# Patient Record
Sex: Female | Born: 1975
Health system: Southern US, Community
[De-identification: ages and names within clinical notes are randomized; demographics above are authoritative.]

## PROBLEM LIST (undated history)

## (undated) DIAGNOSIS — Z86718 Personal history of other venous thrombosis and embolism: Secondary | ICD-10-CM

## (undated) DIAGNOSIS — M329 Systemic lupus erythematosus, unspecified: Secondary | ICD-10-CM

## (undated) DIAGNOSIS — IMO0002 Reserved for concepts with insufficient information to code with codable children: Secondary | ICD-10-CM

## (undated) DIAGNOSIS — N200 Calculus of kidney: Secondary | ICD-10-CM

## (undated) DIAGNOSIS — R760 Raised antibody titer: Secondary | ICD-10-CM

## (undated) DIAGNOSIS — D689 Coagulation defect, unspecified: Secondary | ICD-10-CM

## (undated) HISTORY — PX: OTHER SURGICAL HISTORY: SHX169

## (undated) HISTORY — DX: Coagulation defect, unspecified: D68.9

## (undated) HISTORY — DX: Personal history of other venous thrombosis and embolism: Z86.718

## (undated) HISTORY — DX: Systemic lupus erythematosus, unspecified: M32.9

## (undated) HISTORY — PX: FINGER SURGERY: SHX640

## (undated) HISTORY — DX: Reserved for concepts with insufficient information to code with codable children: IMO0002

## (undated) HISTORY — DX: Raised antibody titer: R76.0

## (undated) HISTORY — DX: Calculus of kidney: N20.0

---

## 2002-07-11 ENCOUNTER — Encounter: Payer: Self-pay | Admitting: Orthopedic Surgery

## 2002-07-11 ENCOUNTER — Ambulatory Visit (HOSPITAL_COMMUNITY): Admission: RE | Admit: 2002-07-11 | Discharge: 2002-07-11 | Payer: Self-pay | Admitting: Orthopedic Surgery

## 2016-02-22 LAB — HM PAP SMEAR

## 2017-05-08 DIAGNOSIS — M9904 Segmental and somatic dysfunction of sacral region: Secondary | ICD-10-CM | POA: Diagnosis not present

## 2017-05-08 DIAGNOSIS — M9903 Segmental and somatic dysfunction of lumbar region: Secondary | ICD-10-CM | POA: Diagnosis not present

## 2017-05-08 DIAGNOSIS — M9905 Segmental and somatic dysfunction of pelvic region: Secondary | ICD-10-CM | POA: Diagnosis not present

## 2017-05-11 DIAGNOSIS — Z86718 Personal history of other venous thrombosis and embolism: Secondary | ICD-10-CM | POA: Diagnosis not present

## 2017-05-11 DIAGNOSIS — D6861 Antiphospholipid syndrome: Secondary | ICD-10-CM | POA: Diagnosis not present

## 2017-05-16 DIAGNOSIS — D6861 Antiphospholipid syndrome: Secondary | ICD-10-CM | POA: Diagnosis not present

## 2017-05-16 DIAGNOSIS — Z86718 Personal history of other venous thrombosis and embolism: Secondary | ICD-10-CM | POA: Diagnosis not present

## 2017-05-18 DIAGNOSIS — D6861 Antiphospholipid syndrome: Secondary | ICD-10-CM | POA: Diagnosis not present

## 2017-05-18 DIAGNOSIS — Z86718 Personal history of other venous thrombosis and embolism: Secondary | ICD-10-CM | POA: Diagnosis not present

## 2017-05-21 DIAGNOSIS — D6861 Antiphospholipid syndrome: Secondary | ICD-10-CM | POA: Diagnosis not present

## 2017-05-21 DIAGNOSIS — Z86718 Personal history of other venous thrombosis and embolism: Secondary | ICD-10-CM | POA: Diagnosis not present

## 2017-05-25 DIAGNOSIS — M329 Systemic lupus erythematosus, unspecified: Secondary | ICD-10-CM | POA: Diagnosis not present

## 2017-05-30 DIAGNOSIS — M9904 Segmental and somatic dysfunction of sacral region: Secondary | ICD-10-CM | POA: Diagnosis not present

## 2017-05-30 DIAGNOSIS — M9903 Segmental and somatic dysfunction of lumbar region: Secondary | ICD-10-CM | POA: Diagnosis not present

## 2017-05-30 DIAGNOSIS — M9905 Segmental and somatic dysfunction of pelvic region: Secondary | ICD-10-CM | POA: Diagnosis not present

## 2017-06-01 DIAGNOSIS — Z86718 Personal history of other venous thrombosis and embolism: Secondary | ICD-10-CM | POA: Diagnosis not present

## 2017-06-01 DIAGNOSIS — D6861 Antiphospholipid syndrome: Secondary | ICD-10-CM | POA: Diagnosis not present

## 2017-06-10 DIAGNOSIS — M329 Systemic lupus erythematosus, unspecified: Secondary | ICD-10-CM | POA: Diagnosis not present

## 2017-06-10 DIAGNOSIS — D6861 Antiphospholipid syndrome: Secondary | ICD-10-CM | POA: Diagnosis not present

## 2017-06-28 DIAGNOSIS — M9903 Segmental and somatic dysfunction of lumbar region: Secondary | ICD-10-CM | POA: Diagnosis not present

## 2017-06-28 DIAGNOSIS — M9904 Segmental and somatic dysfunction of sacral region: Secondary | ICD-10-CM | POA: Diagnosis not present

## 2017-06-28 DIAGNOSIS — M9905 Segmental and somatic dysfunction of pelvic region: Secondary | ICD-10-CM | POA: Diagnosis not present

## 2017-07-18 DIAGNOSIS — M9905 Segmental and somatic dysfunction of pelvic region: Secondary | ICD-10-CM | POA: Diagnosis not present

## 2017-07-18 DIAGNOSIS — M9904 Segmental and somatic dysfunction of sacral region: Secondary | ICD-10-CM | POA: Diagnosis not present

## 2017-07-18 DIAGNOSIS — M9903 Segmental and somatic dysfunction of lumbar region: Secondary | ICD-10-CM | POA: Diagnosis not present

## 2017-07-24 DIAGNOSIS — M9905 Segmental and somatic dysfunction of pelvic region: Secondary | ICD-10-CM | POA: Diagnosis not present

## 2017-07-24 DIAGNOSIS — M9904 Segmental and somatic dysfunction of sacral region: Secondary | ICD-10-CM | POA: Diagnosis not present

## 2017-07-24 DIAGNOSIS — M9903 Segmental and somatic dysfunction of lumbar region: Secondary | ICD-10-CM | POA: Diagnosis not present

## 2017-07-27 DIAGNOSIS — Z23 Encounter for immunization: Secondary | ICD-10-CM | POA: Diagnosis not present

## 2017-08-02 DIAGNOSIS — M9905 Segmental and somatic dysfunction of pelvic region: Secondary | ICD-10-CM | POA: Diagnosis not present

## 2017-08-02 DIAGNOSIS — M9903 Segmental and somatic dysfunction of lumbar region: Secondary | ICD-10-CM | POA: Diagnosis not present

## 2017-08-02 DIAGNOSIS — M9904 Segmental and somatic dysfunction of sacral region: Secondary | ICD-10-CM | POA: Diagnosis not present

## 2017-08-06 ENCOUNTER — Encounter: Payer: Self-pay | Admitting: Gastroenterology

## 2017-08-09 DIAGNOSIS — M9904 Segmental and somatic dysfunction of sacral region: Secondary | ICD-10-CM | POA: Diagnosis not present

## 2017-08-09 DIAGNOSIS — M9903 Segmental and somatic dysfunction of lumbar region: Secondary | ICD-10-CM | POA: Diagnosis not present

## 2017-08-09 DIAGNOSIS — M9905 Segmental and somatic dysfunction of pelvic region: Secondary | ICD-10-CM | POA: Diagnosis not present

## 2017-09-07 DIAGNOSIS — N921 Excessive and frequent menstruation with irregular cycle: Secondary | ICD-10-CM | POA: Diagnosis not present

## 2017-09-07 DIAGNOSIS — Z86718 Personal history of other venous thrombosis and embolism: Secondary | ICD-10-CM | POA: Diagnosis not present

## 2017-09-07 DIAGNOSIS — Z7901 Long term (current) use of anticoagulants: Secondary | ICD-10-CM | POA: Diagnosis not present

## 2017-09-07 DIAGNOSIS — Z5181 Encounter for therapeutic drug level monitoring: Secondary | ICD-10-CM | POA: Diagnosis not present

## 2017-09-11 DIAGNOSIS — M3219 Other organ or system involvement in systemic lupus erythematosus: Secondary | ICD-10-CM | POA: Diagnosis not present

## 2017-09-11 DIAGNOSIS — R768 Other specified abnormal immunological findings in serum: Secondary | ICD-10-CM | POA: Diagnosis not present

## 2017-09-11 DIAGNOSIS — I80201 Phlebitis and thrombophlebitis of unspecified deep vessels of right lower extremity: Secondary | ICD-10-CM | POA: Diagnosis not present

## 2017-09-11 DIAGNOSIS — R7982 Elevated C-reactive protein (CRP): Secondary | ICD-10-CM | POA: Diagnosis not present

## 2017-09-12 DIAGNOSIS — R5383 Other fatigue: Secondary | ICD-10-CM | POA: Diagnosis not present

## 2017-09-12 DIAGNOSIS — M3219 Other organ or system involvement in systemic lupus erythematosus: Secondary | ICD-10-CM | POA: Diagnosis not present

## 2017-09-14 DIAGNOSIS — Z86718 Personal history of other venous thrombosis and embolism: Secondary | ICD-10-CM | POA: Diagnosis not present

## 2017-09-19 ENCOUNTER — Encounter: Payer: Self-pay | Admitting: Gastroenterology

## 2017-09-19 ENCOUNTER — Ambulatory Visit: Payer: 59 | Admitting: Gastroenterology

## 2017-09-19 ENCOUNTER — Telehealth: Payer: Self-pay

## 2017-09-19 ENCOUNTER — Encounter (INDEPENDENT_AMBULATORY_CARE_PROVIDER_SITE_OTHER): Payer: Self-pay

## 2017-09-19 VITALS — BP 106/74 | HR 80 | Ht 67.0 in | Wt 203.0 lb

## 2017-09-19 DIAGNOSIS — K921 Melena: Secondary | ICD-10-CM | POA: Diagnosis not present

## 2017-09-19 DIAGNOSIS — Z7901 Long term (current) use of anticoagulants: Secondary | ICD-10-CM | POA: Diagnosis not present

## 2017-09-19 MED ORDER — NA SULFATE-K SULFATE-MG SULF 17.5-3.13-1.6 GM/177ML PO SOLN: 1.0000 | Freq: Once | ORAL | 0 refills | Status: AC

## 2017-09-19 NOTE — Progress Notes (Signed)
History of Present Illness: This is a 41 year old female for the evaluation of hemorrhoids. She has a history of antiphospholipid syndrome and is currently maintained on Xarelto.  She relates hemorrhoids problems starting during her recent pregnancy.  She delivered in September and took Lovenox instead of Xarelto during pregnancy.  She relates intermittent difficulties with rectal bleeding and swelling.  She relates painful, swollen hemorrhoids protruding on some occasions and intermittent small amounts of blood with bowel movements at other times.  Her symptoms have not been active for the past several weeks.  She states she frequently has loose, urgent bowel movements which alternate with regular bowel movements.  Denies weight loss, abdominal pain, constipation, change in stool caliber, melena, nausea, vomiting, dysphagia, reflux symptoms, chest pain.    Allergies  Allergen Reactions  . Ciprofloxacin Nausea And Vomiting    Pt experiences diarrhea nausea and vomiting   Outpatient Medications Prior to Visit  Medication Sig Dispense Refill  . Hydroxychloroquine Sulfate (PLAQUENIL PO) Take 100 mg by mouth 2 (two) times daily.    . Omega-3 Fatty Acids (FISH OIL PO) Take by mouth daily.    . rivaroxaban (XARELTO) 20 MG TABS tablet Take 20 mg by mouth daily with supper.     No facility-administered medications prior to visit.    Past Medical History:  Diagnosis Date  . History of blood clots   . Kidney stones   . Lupus    Past Surgical History:  Procedure Laterality Date  . None     Social History   Socioeconomic History  . Marital status: Married    Spouse name: None  . Number of children: 2  . Years of education: None  . Highest education level: None  Social Needs  . Financial resource strain: None  . Food insecurity - worry: None  . Food insecurity - inability: None  . Transportation needs - medical: None  . Transportation needs - non-medical: None  Occupational History    . Occupation: [employer]  Tobacco Use  . Smoking status: Never Smoker  . Smokeless tobacco: Never Used  Substance and Sexual Activity  . Alcohol use: Yes    Comment: socially very rare  . Drug use: No  . Sexual activity: None  Other Topics Concern  . None  Social History Narrative  . None   Family History  Problem Relation Age of Onset  . Healthy Mother   . Hypertension Father   . Hypercholesterolemia Father   . Diabetes Father   . Colon cancer Maternal Aunt   . Diverticulitis Paternal Uncle   . Breast cancer Paternal Grandmother   . Lung cancer Paternal Grandfather       Review of Systems: Pertinent positive and negative review of systems were noted in the above HPI section. All other review of systems were otherwise negative.   Physical Exam: General: Well developed, well nourished, no acute distress Head: Normocephalic and atraumatic Eyes:  sclerae anicteric, EOMI Ears: Normal auditory acuity Mouth: No deformity or lesions Neck: Supple, no masses or thyromegaly Lungs: Clear throughout to auscultation Heart: Regular rate and rhythm; no murmurs, rubs or bruits Abdomen: Soft, non tender and non distended. No masses, hepatosplenomegaly or hernias noted. Normal Bowel sounds Rectal: Small external hemorrhoidal tags no internal lesions or tenderness Hemoccult negative brown stool in the vault Musculoskeletal: Symmetrical with no gross deformities  Skin: No lesions on visible extremities Pulses:  Normal pulses noted Extremities: No clubbing, cyanosis, edema or deformities noted Neurological:  Alert oriented x 4, grossly nonfocal Cervical Nodes:  No significant cervical adenopathy Inguinal Nodes: No significant inguinal adenopathy Psychological:  Alert and cooperative. Normal mood and affect  Assessment and Recommendations:  1.  Suspected prolapsing internal hemorrhoids with intermittent prolapse, pain and bleeding.  Rule out colorectal neoplasms and other disorders.  Begin  Preparation H suppositories PR daily for 1 week and then daily as needed.  Schedule colonoscopy.  Discussed possible hemorrhoidal banding following colonoscopy if internal hemorrhoids are found at colonoscopy. The risks (including bleeding, perforation, infection, missed lesions, medication reactions and possible hospitalization or surgery if complications occur), benefits, and alternatives to colonoscopy with possible biopsy and possible polypectomy were discussed with the patient and they consent to proceed.   2. Hold Xarelto 2 days before procedure - will instruct when and how to resume after procedure. Low but real risk of cardiovascular event such as heart attack, stroke, embolism, thrombosis or ischemia/infarct of other organs off Xarelto explained and need to seek urgent help if this occurs. The patient consents to proceed. Will communicate by phone or EMR with patient's prescribing provider to confirm that holding Xarelto is reasonable in this case.

## 2017-09-19 NOTE — Telephone Encounter (Signed)
Faxed anticoagulant clearance to Dr. Virginia Crew[facility]Jason Boyd [facility] at [phone]

## 2017-09-19 NOTE — Patient Instructions (Signed)
You have been scheduled for a colonoscopy. Please follow written instructions given to you at your visit today.  Please pick up your prep supplies at the pharmacy within the next 1-3 days. If you use inhalers (even only as needed), please bring them with you on the day of your procedure. Your physician has requested that you go to www.startemmi.com and enter the access code given to you at your visit today. This web site gives a general overview about your procedure. However, you should still follow specific instructions given to you by our office regarding your preparation for the procedure.  You can use over the counter preparation H suppositories daily as needed.   Thank you for choosing me and [facility].  [name], [name], MD., Clementeen GrahamFACG

## 2017-09-19 NOTE — Telephone Encounter (Signed)
Zoe Miles September 09, 1976 [id number]  Dear Dr. Virginia CrewsJason Boyd:  We have scheduled the above named patient for a(n) Colonoscopy procedure. Our records show that (s)he is on anticoagulation therapy.  Please advise as to whether the patient may come off their therapy of Xarelto 2 days prior to their procedure which is scheduled for 11/09/17.  Please route your response to Christie NottinghamAmanda Jamarea Selner, CMA or fax response to [phone].  Sincerely,    [facility]

## 2017-09-20 NOTE — Telephone Encounter (Signed)
Received fax from Dr. Leavy CellaBoyd stating patient can hold Xarelto 2 days prior to her Colonoscopy and restart the day after. Left a message for patient to return my call.

## 2017-09-21 NOTE — Telephone Encounter (Signed)
Patient informed of Dr. Sharrell KuBoyd's recommendations to hold Xarelto 2 days prior to Colonoscopy. Patient verbalized understanding.

## 2017-10-26 ENCOUNTER — Encounter: Payer: Self-pay | Admitting: Gastroenterology

## 2017-10-26 DIAGNOSIS — N920 Excessive and frequent menstruation with regular cycle: Secondary | ICD-10-CM | POA: Diagnosis not present

## 2017-11-09 ENCOUNTER — Other Ambulatory Visit: Payer: Self-pay

## 2017-11-09 ENCOUNTER — Ambulatory Visit (AMBULATORY_SURGERY_CENTER): Payer: 59 | Admitting: Gastroenterology

## 2017-11-09 ENCOUNTER — Encounter: Payer: Self-pay | Admitting: Gastroenterology

## 2017-11-09 VITALS — BP 111/69 | HR 67 | Temp 98.7°F | Resp 15 | Ht 67.0 in | Wt 203.0 lb

## 2017-11-09 DIAGNOSIS — K921 Melena: Secondary | ICD-10-CM

## 2017-11-09 DIAGNOSIS — K648 Other hemorrhoids: Secondary | ICD-10-CM | POA: Diagnosis not present

## 2017-11-09 MED ORDER — SODIUM CHLORIDE 0.9 % IV SOLN: 500.0000 mL | Freq: Once | INTRAVENOUS | Status: DC

## 2017-11-09 NOTE — Patient Instructions (Signed)
RESUME XARELTO TODAY AT PRIOR DOSE.   YOU HAD AN ENDOSCOPIC PROCEDURE TODAY AT THE [facility]:   Refer to the procedure report that was given to you for any specific questions about what was found during the examination.  If the procedure report does not answer your questions, please call your gastroenterologist to clarify.  If you requested that your care partner not be given the details of your procedure findings, then the procedure report has been included in a sealed envelope for you to review at your convenience later.  YOU SHOULD EXPECT: Some feelings of bloating in the abdomen. Passage of more gas than usual.  Walking can help get rid of the air that was put into your GI tract during the procedure and reduce the bloating. If you had a lower endoscopy (such as a colonoscopy or flexible sigmoidoscopy) you may notice spotting of blood in your stool or on the toilet paper. If you underwent a bowel prep for your procedure, you may not have a normal bowel movement for a few days.  Please Note:  You might notice some irritation and congestion in your nose or some drainage.  This is from the oxygen used during your procedure.  There is no need for concern and it should clear up in a day or so.  SYMPTOMS TO REPORT IMMEDIATELY:   Following lower endoscopy (colonoscopy or flexible sigmoidoscopy):  Excessive amounts of blood in the stool  Significant tenderness or worsening of abdominal pains  Swelling of the abdomen that is new, acute  Fever of 100F or higher   For urgent or emergent issues, a gastroenterologist can be reached at any hour by calling [phone].   DIET:  We do recommend a small meal at first, but then you may proceed to your regular diet.  Drink plenty of fluids but you should avoid alcoholic beverages for 24 hours.  ACTIVITY:  You should plan to take it easy for the rest of today and you should NOT DRIVE or use heavy machinery until tomorrow (because of the  sedation medicines used during the test).    FOLLOW UP: Our staff will call the number listed on your records the next business day following your procedure to check on you and address any questions or concerns that you may have regarding the information given to you following your procedure. If we do not reach you, we will leave a message.  However, if you are feeling well and you are not experiencing any problems, there is no need to return our call.  We will assume that you have returned to your regular daily activities without incident.  If any biopsies were taken you will be contacted by phone or by letter within the next 1-3 weeks.  Please call us at [phone] if you have not heard about the biopsies in 3 weeks.    SIGNATURES/CONFIDENTIALITY: You and/or your care partner have signed paperwork which will be entered into your electronic medical record.  These signatures attest to the fact that that the information above on your After Visit Summary has been reviewed and is understood.  Full responsibility of the confidentiality of this discharge information lies with you and/or your care-partner.

## 2017-11-09 NOTE — Progress Notes (Signed)
  [facility] Anesthesia Post-op Note  Patient: Zoe Miles  Procedure(s) Performed: colonoscopy  Patient Location: [facility] - Recovery Area  Anesthesia Type: Deep Sedation/Propofol  Level of Consciousness: awake, oriented and patient cooperative  Airway and Oxygen Therapy: Patient Spontanous Breathing  Post-op Pain: none  Post-op Assessment:  Post-op Vital signs reviewed, Patient's Cardiovascular Status Stable, Respiratory Function Stable, Patent Airway, No signs of Nausea or vomiting and Pain level controlled  Post-op Vital Signs: Reviewed and stable  Complications: No apparent anesthesia complications  Olusegun Gerstenberger E Ermelinda Eckert 11:11 AM

## 2017-11-09 NOTE — Op Note (Signed)
[facility] Patient Name: Zoe Miles Procedure Date: 11/09/2017 10:48 AM MRN: [id number] Endoscopist: Meryl DareMalcolm T Jasir Rother , MD Age: 42 Referring MD:  Date of Birth: 05/31/1976 Gender: Female Account #: [id number] Procedure:                Colonoscopy Indications:              Hematochezia Medicines:                Monitored Anesthesia Care Procedure:                Pre-Anesthesia Assessment:                           - Prior to the procedure, a History and Physical                            was performed, and patient medications and                            allergies were reviewed. The patient's tolerance of                            previous anesthesia was also reviewed. The risks                            and benefits of the procedure and the sedation                            options and risks were discussed with the patient.                            All questions were answered, and informed consent                            was obtained. Prior Anticoagulants: The patient has                            taken Xarelto (rivaroxaban), last dose was 2 days                            prior to procedure. ASA Grade Assessment: III - A                            patient with severe systemic disease. After                            reviewing the risks and benefits, the patient was                            deemed in satisfactory condition to undergo the                            procedure.  After obtaining informed consent, the colonoscope                            was passed under direct vision. Throughout the                            procedure, the patient's blood pressure, pulse, and                            oxygen saturations were monitored continuously. The                            Colonoscope was introduced through the anus and                            advanced to the the cecum, identified by   appendiceal orifice and ileocecal valve. The                            ileocecal valve, appendiceal orifice, and rectum                            were photographed. The quality of the bowel                            preparation was excellent. The colonoscopy was                            performed without difficulty. The patient tolerated                            the procedure well. Scope In: 10:51:29 AM Scope Out: 11:01:41 AM Scope Withdrawal Time: 0 hours 8 minutes 39 seconds  Total Procedure Duration: 0 hours 10 minutes 12 seconds  Findings:                 The perianal exam findings include non-thrombosed                            external hemorrhoids.                           Internal hemorrhoids were found during                            retroflexion. The hemorrhoids were small and Grade                            I (internal hemorrhoids that do not prolapse).                           The exam was otherwise without abnormality on                            direct and retroflexion views. Complications:  No immediate complications. Estimated blood loss:                            None. Estimated Blood Loss:     Estimated blood loss: none. Impression:               - Non-thrombosed external hemorrhoids found on                            perianal exam.                           - Small internal hemorrhoids.                           - The examination was otherwise normal on direct                            and retroflexion views.                           - No specimens collected. Recommendation:           - Repeat colonoscopy in 10 years for screening                            purposes.                           - Resume Xarelto (rivaroxaban) today at prior dose.                            Refer to managing physician for further adjustment                            of therapy.                           - Patient has a contact number available for                             emergencies. The signs and symptoms of potential                            delayed complications were discussed with the                            patient. Return to normal activities tomorrow.                            Written discharge instructions were provided to the                            patient.                           - Resume previous diet.                           -  Continue present medications. Meryl Dare, MD 11/09/2017 11:05:41 AM This report has been signed electronically.

## 2017-11-12 ENCOUNTER — Telehealth: Payer: Self-pay | Admitting: *Deleted

## 2017-11-12 ENCOUNTER — Telehealth: Payer: Self-pay

## 2017-11-12 NOTE — Telephone Encounter (Signed)
  Follow up Call-  Call back number 11/09/2017  Post procedure Call Back phone  # [phone]  Permission to leave phone message Yes  Some recent data might be hidden     Patient questions:  Do you have a fever, pain , or abdominal swelling? No. Pain Score  0 *  Have you tolerated food without any problems? Yes.    Have you been able to return to your normal activities? Yes.    Do you have any questions about your discharge instructions: Diet   No. Medications  No. Follow up visit  No.  Do you have questions or concerns about your Care? No.  Actions: * If pain score is 4 or above: No action needed, pain <4.

## 2017-11-12 NOTE — Telephone Encounter (Signed)
Attempted to reach patient for post-procedure f/u call. No answer. Left message that we will make another attempt to reach her again later today and for her to please not hesitate to call us if she has any questions/concerns regarding her care. 

## 2017-11-20 ENCOUNTER — Ambulatory Visit (INDEPENDENT_AMBULATORY_CARE_PROVIDER_SITE_OTHER): Payer: 59 | Admitting: Internal Medicine

## 2017-11-20 ENCOUNTER — Other Ambulatory Visit (INDEPENDENT_AMBULATORY_CARE_PROVIDER_SITE_OTHER): Payer: 59

## 2017-11-20 ENCOUNTER — Encounter: Payer: Self-pay | Admitting: Internal Medicine

## 2017-11-20 VITALS — BP 124/72 | HR 75 | Temp 98.1°F | Resp 16 | Wt 206.0 lb

## 2017-11-20 DIAGNOSIS — Z0000 Encounter for general adult medical examination without abnormal findings: Secondary | ICD-10-CM | POA: Diagnosis not present

## 2017-11-20 DIAGNOSIS — M329 Systemic lupus erythematosus, unspecified: Secondary | ICD-10-CM

## 2017-11-20 DIAGNOSIS — D6862 Lupus anticoagulant syndrome: Secondary | ICD-10-CM | POA: Diagnosis not present

## 2017-11-20 DIAGNOSIS — L309 Dermatitis, unspecified: Secondary | ICD-10-CM | POA: Diagnosis not present

## 2017-11-20 DIAGNOSIS — Z1239 Encounter for other screening for malignant neoplasm of breast: Secondary | ICD-10-CM

## 2017-11-20 LAB — LIPID PANEL
Cholesterol: 113 mg/dL (ref 0–200)
HDL: 69.8 mg/dL (ref >39.00)
LDL Cholesterol: 31 mg/dL (ref 0–99)
NonHDL: 43.04
Total CHOL/HDL Ratio: 2
Triglycerides: 58 mg/dL (ref 0.0–149.0)
VLDL: 11.6 mg/dL (ref 0.0–40.0)

## 2017-11-20 LAB — CBC WITH DIFFERENTIAL/PLATELET
Basophils Absolute: 0 10*3/uL (ref 0.0–0.1)
Basophils Relative: 0.9 % (ref 0.0–3.0)
Eosinophils Absolute: 0.1 10*3/uL (ref 0.0–0.7)
Eosinophils Relative: 2.3 % (ref 0.0–5.0)
HCT: 42.3 % (ref 36.0–46.0)
Hemoglobin: 14.1 g/dL (ref 12.0–15.0)
Lymphocytes Relative: 21.8 % (ref 12.0–46.0)
Lymphs Abs: 1.2 10*3/uL (ref 0.7–4.0)
MCHC: 33.3 g/dL (ref 30.0–36.0)
MCV: 89.3 fl (ref 78.0–100.0)
Monocytes Absolute: 0.5 10*3/uL (ref 0.1–1.0)
Monocytes Relative: 8.7 % (ref 3.0–12.0)
Neutro Abs: 3.6 10*3/uL (ref 1.4–7.7)
Neutrophils Relative %: 66.3 % (ref 43.0–77.0)
Platelets: 230 10*3/uL (ref 150.0–400.0)
RBC: 4.73 Mil/uL (ref 3.87–5.11)
RDW: 13.8 % (ref 11.5–15.5)
WBC: 5.4 10*3/uL (ref 4.0–10.5)

## 2017-11-20 LAB — COMPREHENSIVE METABOLIC PANEL
ALT: 14 U/L (ref 0–35)
AST: 13 U/L (ref 0–37)
Albumin: 4.4 g/dL (ref 3.5–5.2)
Alkaline Phosphatase: 33 U/L — ABNORMAL LOW (ref 39–117)
BUN: 13 mg/dL (ref 6–23)
CO2: 30 mEq/L (ref 19–32)
Calcium: 9.7 mg/dL (ref 8.4–10.5)
Chloride: 103 mEq/L (ref 96–112)
Creatinine, Ser: 0.75 mg/dL (ref 0.40–1.20)
GFR: 90.86 mL/min (ref >60.00)
Glucose, Bld: 92 mg/dL (ref 70–99)
Potassium: 4 mEq/L (ref 3.5–5.1)
Sodium: 139 mEq/L (ref 135–145)
Total Bilirubin: 0.6 mg/dL (ref 0.2–1.2)
Total Protein: 7.3 g/dL (ref 6.0–8.3)

## 2017-11-20 MED ORDER — CRISABOROLE 2 % EX OINT: 1.0000 | TOPICAL_OINTMENT | Freq: Two times a day (BID) | CUTANEOUS | 5 refills | Status: DC

## 2017-11-20 NOTE — Progress Notes (Signed)
Subjective:  Patient ID: Zoe Miles, female    DOB: 12-31-1975  Age: 42 y.o. MRN: [id number]  CC: Annual Exam and Rash   HPI Zoe Miles presents for a complete physical and establishing as a new patient.  Zoe Miles complains of a several year history of recurrent itchy rash on her left middle and left ring finger.  Zoe Miles describes itchy clear bumps that develop and then peel-away and scale.  Zoe Miles has treated it unsuccessfully with multiple over-the-counter remedies.  The rash does not develop elsewhere.  Zoe Miles is also hypercoagulable due to a lupus anticoagulant and a history of lupus and requests a referral to rheumatology and hematology.  History Zoe Miles has a past medical history of Clotting disorder (HCC), History of blood clots, Kidney stones, and Lupus.   Zoe Miles has a past surgical history that includes None and Finger surgery.   Her family history includes Breast cancer in her paternal grandmother; Cancer in her paternal grandfather and paternal grandmother; Colon cancer in her maternal aunt; Diabetes in her father and maternal grandmother; Diverticulitis in her paternal uncle; Healthy in her mother; Heart disease in her maternal grandfather; Hypercholesterolemia in her father; Hypertension in her father; Lung cancer in her paternal grandfather.Zoe Miles reports that  has never smoked. Zoe Miles has never used smokeless tobacco. Zoe Miles reports that Zoe Miles drinks alcohol. Zoe Miles reports that Zoe Miles does not use drugs.  Outpatient Medications Prior to Visit  Medication Sig Dispense Refill  . Hydroxychloroquine Sulfate (PLAQUENIL PO) Take 100 mg by mouth 2 (two) times daily.    . Omega-3 Fatty Acids (FISH OIL PO) Take by mouth daily.    . rivaroxaban (XARELTO) 20 MG TABS tablet Take 20 mg by mouth daily with supper.    Marland Kitchen 0.9 %  sodium chloride infusion      No facility-administered medications prior to visit.     ROS Review of Systems  Constitutional: Negative for diaphoresis and fatigue.  Respiratory:  Negative for cough, chest tightness and shortness of breath.   Cardiovascular: Negative for chest pain, palpitations and leg swelling.  Gastrointestinal: Negative for abdominal pain, constipation, diarrhea, nausea and vomiting.  Endocrine: Negative.   Genitourinary: Negative.   Musculoskeletal: Negative.   Skin: Positive for rash. Negative for color change and pallor.  Allergic/Immunologic: Negative.   Neurological: Negative.   Hematological: Negative for adenopathy. Does not bruise/bleed easily.  Psychiatric/Behavioral: Negative.   All other systems reviewed and are negative.   Objective:  BP 124/72 (BP Location: Left Arm, Patient Position: Sitting, Cuff Size: Large)   Pulse 75   Temp 98.1 F (36.7 C) (Oral)   Resp 16   Wt 206 lb (93.4 kg)   LMP 10/22/2017   SpO2 98%   BMI 32.26 kg/m   Physical Exam  Constitutional: Zoe Miles is oriented to person, place, and time. No distress.  HENT:  Mouth/Throat: Oropharynx is clear and moist. No oropharyngeal exudate.  Eyes: Conjunctivae are normal. Left eye exhibits no discharge. No scleral icterus.  Neck: Normal range of motion. Neck supple. No JVD present. No thyromegaly present.  Cardiovascular: Normal rate, regular rhythm and normal heart sounds. Exam reveals no gallop and no friction rub.  No murmur heard. Pulmonary/Chest: Effort normal and breath sounds normal. No respiratory distress. Zoe Miles has no wheezes. Zoe Miles has no rales.  Abdominal: Soft. Bowel sounds are normal. Zoe Miles exhibits no distension and no mass. There is no tenderness. There is no guarding.  Musculoskeletal: Normal range of motion. Zoe Miles exhibits no edema, tenderness or deformity.  Lymphadenopathy:    Zoe Miles has no cervical adenopathy.  Neurological: Zoe Miles is alert and oriented to person, place, and time.  Skin: Skin is warm and dry. Rash noted. Zoe Miles is not diaphoretic. No erythema. No pallor.  On the dorsum, and sides of the distal phalanges of the left middle finger and left ring  finger there are papules, scaling, and peeling.  There are no vesicles or pustules.  There is no erythema, exudate, warmth, or induration.  Vitals reviewed.   Lab Results  Component Value Date   WBC 5.4 11/20/2017   HGB 14.1 11/20/2017   HCT 42.3 11/20/2017   PLT 230.0 11/20/2017   GLUCOSE 92 11/20/2017   CHOL 113 11/20/2017   TRIG 58.0 11/20/2017   HDL 69.80 11/20/2017   LDLCALC 31 11/20/2017   ALT 14 11/20/2017   AST 13 11/20/2017   NA 139 11/20/2017   K 4.0 11/20/2017   CL 103 11/20/2017   CREATININE 0.75 11/20/2017   BUN 13 11/20/2017   CO2 30 11/20/2017    Assessment & Plan:   Zoe Miles was seen today for annual exam and rash.  Diagnoses and all orders for this visit:  Breast cancer screening -     MM Digital Screening; Future  Routine general medical examination at a health care facility- Exam completed, labs reviewed, vaccines reviewed and updated, her Pap smear is up-to-date, Zoe Miles is referred for mammogram, patient education material was given. -     Lipid panel; Future  Systemic lupus erythematosus, unspecified SLE type, unspecified organ involvement status (HCC) -     Comprehensive metabolic panel; Future -     CBC with Differential/Platelet; Future -     Ambulatory referral to Rheumatology  Lupus anticoagulant with hypercoagulable state (HCC) -     Comprehensive metabolic panel; Future -     CBC with Differential/Platelet; Future -     Ambulatory referral to Hematology  Eczema of tip of finger -     Crisaborole (EUCRISA) 2 % OINT; Apply 1 Act topically 2 (two) times daily.   I am having Zoe ChardAmanda L. Miles start on Crisaborole. I am also having her maintain her rivaroxaban, Hydroxychloroquine Sulfate (PLAQUENIL PO), and Omega-3 Fatty Acids (FISH OIL PO). We will stop administering sodium chloride.  Meds ordered this encounter  Medications  . Crisaborole (EUCRISA) 2 % OINT    Sig: Apply 1 Act topically 2 (two) times daily.    Dispense:  60 g    Refill:  5      Follow-up: Return if symptoms worsen or fail to improve.  Sanda Lingerhomas Darren Caldron, MD

## 2017-11-20 NOTE — Progress Notes (Signed)
[facility] getting exam notes.

## 2017-11-20 NOTE — Patient Instructions (Signed)
Preventive Care 18-39 Years, Female Preventive care refers to lifestyle choices and visits with your health care provider that can promote health and wellness. What does preventive care include?  A yearly physical exam. This is also called an annual well check.  Dental exams once or twice a year.  Routine eye exams. Ask your health care provider how often you should have your eyes checked.  Personal lifestyle choices, including: ? Daily care of your teeth and gums. ? Regular physical activity. ? Eating a healthy diet. ? Avoiding tobacco and drug use. ? Limiting alcohol use. ? Practicing safe sex. ? Taking vitamin and mineral supplements as recommended by your health care provider. What happens during an annual well check? The services and screenings done by your health care provider during your annual well check will depend on your age, overall health, lifestyle risk factors, and family history of disease. Counseling Your health care provider may ask you questions about your:  Alcohol use.  Tobacco use.  Drug use.  Emotional well-being.  Home and relationship well-being.  Sexual activity.  Eating habits.  Work and work Statistician.  Method of birth control.  Menstrual cycle.  Pregnancy history.  Screening You may have the following tests or measurements:  Height, weight, and BMI.  Diabetes screening. This is done by checking your blood sugar (glucose) after you have not eaten for a while (fasting).  Blood pressure.  Lipid and cholesterol levels. These may be checked every 5 years starting at age 66.  Skin check.  Hepatitis C blood test.  Hepatitis B blood test.  Sexually transmitted disease (STD) testing.  BRCA-related cancer screening. This may be done if you have a family history of breast, ovarian, tubal, or peritoneal cancers.  Pelvic exam and Pap test. This may be done every 3 years starting at age 40. Starting at age 59, this may be done every 5  years if you have a Pap test in combination with an HPV test.  Discuss your test results, treatment options, and if necessary, the need for more tests with your health care provider. Vaccines Your health care provider may recommend certain vaccines, such as:  Influenza vaccine. This is recommended every year.  Tetanus, diphtheria, and acellular pertussis (Tdap, Td) vaccine. You may need a Td booster every 10 years.  Varicella vaccine. You may need this if you have not been vaccinated.  HPV vaccine. If you are 69 or younger, you may need three doses over 6 months.  Measles, mumps, and rubella (MMR) vaccine. You may need at least one dose of MMR. You may also need a second dose.  Pneumococcal 13-valent conjugate (PCV13) vaccine. You may need this if you have certain conditions and were not previously vaccinated.  Pneumococcal polysaccharide (PPSV23) vaccine. You may need one or two doses if you smoke cigarettes or if you have certain conditions.  Meningococcal vaccine. One dose is recommended if you are age 27-21 years and a first-year college student living in a residence hall, or if you have one of several medical conditions. You may also need additional booster doses.  Hepatitis A vaccine. You may need this if you have certain conditions or if you travel or work in places where you may be exposed to hepatitis A.  Hepatitis B vaccine. You may need this if you have certain conditions or if you travel or work in places where you may be exposed to hepatitis B.  Haemophilus influenzae type b (Hib) vaccine. You may need this if  you have certain risk factors.  Talk to your health care provider about which screenings and vaccines you need and how often you need them. This information is not intended to replace advice given to you by your health care provider. Make sure you discuss any questions you have with your health care provider. Document Released: 11/14/2001 Document Revised: 06/07/2016  Document Reviewed: 07/20/2015 Elsevier Interactive Patient Education  [employer].

## 2017-11-23 ENCOUNTER — Encounter: Payer: Self-pay | Admitting: Internal Medicine

## 2017-11-25 ENCOUNTER — Encounter: Payer: Self-pay | Admitting: Internal Medicine

## 2017-12-20 DIAGNOSIS — M9903 Segmental and somatic dysfunction of lumbar region: Secondary | ICD-10-CM | POA: Diagnosis not present

## 2017-12-27 DIAGNOSIS — M9903 Segmental and somatic dysfunction of lumbar region: Secondary | ICD-10-CM | POA: Diagnosis not present

## 2018-01-01 DIAGNOSIS — M9903 Segmental and somatic dysfunction of lumbar region: Secondary | ICD-10-CM | POA: Diagnosis not present

## 2018-01-03 DIAGNOSIS — M9903 Segmental and somatic dysfunction of lumbar region: Secondary | ICD-10-CM | POA: Diagnosis not present

## 2018-01-07 DIAGNOSIS — M9903 Segmental and somatic dysfunction of lumbar region: Secondary | ICD-10-CM | POA: Diagnosis not present

## 2018-01-10 DIAGNOSIS — M9903 Segmental and somatic dysfunction of lumbar region: Secondary | ICD-10-CM | POA: Diagnosis not present

## 2018-01-14 DIAGNOSIS — M9903 Segmental and somatic dysfunction of lumbar region: Secondary | ICD-10-CM | POA: Diagnosis not present

## 2018-01-17 DIAGNOSIS — M9903 Segmental and somatic dysfunction of lumbar region: Secondary | ICD-10-CM | POA: Diagnosis not present

## 2018-01-21 DIAGNOSIS — M9903 Segmental and somatic dysfunction of lumbar region: Secondary | ICD-10-CM | POA: Diagnosis not present

## 2018-01-24 DIAGNOSIS — M9903 Segmental and somatic dysfunction of lumbar region: Secondary | ICD-10-CM | POA: Diagnosis not present

## 2018-02-07 DIAGNOSIS — M9903 Segmental and somatic dysfunction of lumbar region: Secondary | ICD-10-CM | POA: Diagnosis not present

## 2018-02-19 DIAGNOSIS — M9903 Segmental and somatic dysfunction of lumbar region: Secondary | ICD-10-CM | POA: Diagnosis not present

## 2018-03-05 DIAGNOSIS — M9903 Segmental and somatic dysfunction of lumbar region: Secondary | ICD-10-CM | POA: Diagnosis not present

## 2018-03-25 DIAGNOSIS — M329 Systemic lupus erythematosus, unspecified: Secondary | ICD-10-CM | POA: Diagnosis not present

## 2018-03-25 DIAGNOSIS — R21 Rash and other nonspecific skin eruption: Secondary | ICD-10-CM | POA: Diagnosis not present

## 2018-03-25 DIAGNOSIS — R760 Raised antibody titer: Secondary | ICD-10-CM | POA: Diagnosis not present

## 2018-03-29 ENCOUNTER — Ambulatory Visit
Admission: RE | Admit: 2018-03-29 | Discharge: 2018-03-29 | Disposition: A | Discharge status: Home / Self Care | Payer: 59 | Source: Ambulatory Visit | Attending: Internal Medicine | Admitting: Internal Medicine

## 2018-03-29 DIAGNOSIS — Z1231 Encounter for screening mammogram for malignant neoplasm of breast: Secondary | ICD-10-CM | POA: Diagnosis not present

## 2018-03-29 DIAGNOSIS — Z1239 Encounter for other screening for malignant neoplasm of breast: Secondary | ICD-10-CM

## 2018-03-29 LAB — HM MAMMOGRAPHY

## 2018-04-26 DIAGNOSIS — N921 Excessive and frequent menstruation with irregular cycle: Secondary | ICD-10-CM | POA: Diagnosis not present

## 2018-04-26 DIAGNOSIS — Z124 Encounter for screening for malignant neoplasm of cervix: Secondary | ICD-10-CM | POA: Diagnosis not present

## 2018-05-09 ENCOUNTER — Emergency Department (HOSPITAL_COMMUNITY)
Admission: EM | Admit: 2018-05-09 | Discharge: 2018-05-09 | Disposition: A | Discharge status: Home / Self Care | Payer: 59 | Attending: Emergency Medicine | Admitting: Emergency Medicine

## 2018-05-09 ENCOUNTER — Encounter (HOSPITAL_COMMUNITY): Payer: Self-pay | Admitting: *Deleted

## 2018-05-09 ENCOUNTER — Emergency Department (HOSPITAL_COMMUNITY): Payer: 59

## 2018-05-09 DIAGNOSIS — G43809 Other migraine, not intractable, without status migrainosus: Secondary | ICD-10-CM | POA: Insufficient documentation

## 2018-05-09 DIAGNOSIS — Z79899 Other long term (current) drug therapy: Secondary | ICD-10-CM | POA: Insufficient documentation

## 2018-05-09 DIAGNOSIS — R102 Pelvic and perineal pain: Secondary | ICD-10-CM | POA: Diagnosis not present

## 2018-05-09 DIAGNOSIS — N939 Abnormal uterine and vaginal bleeding, unspecified: Secondary | ICD-10-CM | POA: Diagnosis not present

## 2018-05-09 DIAGNOSIS — R51 Headache: Secondary | ICD-10-CM | POA: Diagnosis present

## 2018-05-09 DIAGNOSIS — Z30431 Encounter for routine checking of intrauterine contraceptive device: Secondary | ICD-10-CM | POA: Diagnosis not present

## 2018-05-09 DIAGNOSIS — R1030 Lower abdominal pain, unspecified: Secondary | ICD-10-CM

## 2018-05-09 DIAGNOSIS — R109 Unspecified abdominal pain: Secondary | ICD-10-CM | POA: Diagnosis not present

## 2018-05-09 LAB — CBC
HCT: 42.7 % (ref 36.0–46.0)
Hemoglobin: 13.5 g/dL (ref 12.0–15.0)
MCH: 29.6 pg (ref 26.0–34.0)
MCHC: 31.6 g/dL (ref 30.0–36.0)
MCV: 93.6 fL (ref 78.0–100.0)
Platelets: 206 10*3/uL (ref 150–400)
RBC: 4.56 MIL/uL (ref 3.87–5.11)
RDW: 12.9 % (ref 11.5–15.5)
WBC: 6.5 10*3/uL (ref 4.0–10.5)

## 2018-05-09 LAB — COMPREHENSIVE METABOLIC PANEL
ALT: 13 U/L (ref 0–44)
AST: 14 U/L — ABNORMAL LOW (ref 15–41)
Albumin: 4 g/dL (ref 3.5–5.0)
Alkaline Phosphatase: 31 U/L — ABNORMAL LOW (ref 38–126)
Anion gap: 8 (ref 5–15)
BUN: 6 mg/dL (ref 6–20)
CO2: 28 mmol/L (ref 22–32)
Calcium: 9.1 mg/dL (ref 8.9–10.3)
Chloride: 106 mmol/L (ref 98–111)
Creatinine, Ser: 0.69 mg/dL (ref 0.44–1.00)
GFR calc Af Amer: >60 mL/min (ref >60)
GFR calc non Af Amer: >60 mL/min (ref >60)
Glucose, Bld: 97 mg/dL (ref 70–99)
Potassium: 3.8 mmol/L (ref 3.5–5.1)
Sodium: 142 mmol/L (ref 135–145)
Total Bilirubin: 0.8 mg/dL (ref 0.3–1.2)
Total Protein: 6.8 g/dL (ref 6.5–8.1)

## 2018-05-09 LAB — I-STAT BETA HCG BLOOD, ED (MC, WL, AP ONLY): I-stat hCG, quantitative: <5 m[IU]/mL (ref <5)

## 2018-05-09 LAB — LIPASE, BLOOD: Lipase: 32 U/L (ref 11–51)

## 2018-05-09 MED ORDER — KETOROLAC TROMETHAMINE 15 MG/ML IJ SOLN
15.0000 mg | Freq: Once | INTRAMUSCULAR | Status: AC
Start: 2018-05-09 — End: 2018-05-09
  Administered 2018-05-09: 15 mg via INTRAVENOUS
  Filled 2018-05-09: qty 1

## 2018-05-09 MED ORDER — SODIUM CHLORIDE 0.9 % IV BOLUS
1000.0000 mL | Freq: Once | INTRAVENOUS | Status: AC
  Administered 2018-05-09: 1000 mL via INTRAVENOUS

## 2018-05-09 MED ORDER — METOCLOPRAMIDE HCL 5 MG/ML IJ SOLN
10.0000 mg | Freq: Once | INTRAMUSCULAR | Status: AC
  Administered 2018-05-09: 10 mg via INTRAVENOUS
  Filled 2018-05-09: qty 2

## 2018-05-09 MED ORDER — DIPHENHYDRAMINE HCL 50 MG/ML IJ SOLN
12.5000 mg | Freq: Once | INTRAMUSCULAR | Status: AC
  Administered 2018-05-09: 12.5 mg via INTRAVENOUS
  Filled 2018-05-09: qty 1

## 2018-05-09 MED ORDER — IOHEXOL 300 MG/ML  SOLN
100.0000 mL | Freq: Once | INTRAMUSCULAR | Status: AC | PRN
Start: 2018-05-09 — End: 2018-05-09
  Administered 2018-05-09: 100 mL via INTRAVENOUS

## 2018-05-09 NOTE — Discharge Instructions (Addendum)
Your IUD is malpositioned low in your uterus. This may potentially be the source of your pain. Your CT scan otherwise looks ok. You need to follow-up with your GYN with regard to this.

## 2018-05-09 NOTE — ED Notes (Signed)
Patient verbalizes understanding of discharge instructions. Opportunity for questioning and answers were provided. Ambulatory at discharge in NAD.  

## 2018-05-09 NOTE — ED Triage Notes (Signed)
Pt in c/o headache for the last week which is not normal for her, also lower abdominal pain that radiates into her back, reports nausea but no vomiting, sensitivity to light

## 2018-05-10 DIAGNOSIS — T8389XA Other specified complication of genitourinary prosthetic devices, implants and grafts, initial encounter: Secondary | ICD-10-CM | POA: Diagnosis not present

## 2018-05-10 DIAGNOSIS — N939 Abnormal uterine and vaginal bleeding, unspecified: Secondary | ICD-10-CM | POA: Diagnosis not present

## 2018-05-10 DIAGNOSIS — Z7901 Long term (current) use of anticoagulants: Secondary | ICD-10-CM | POA: Diagnosis not present

## 2018-05-10 DIAGNOSIS — R102 Pelvic and perineal pain: Secondary | ICD-10-CM | POA: Diagnosis not present

## 2018-05-10 DIAGNOSIS — Z30430 Encounter for insertion of intrauterine contraceptive device: Secondary | ICD-10-CM | POA: Diagnosis not present

## 2018-05-12 NOTE — ED Provider Notes (Signed)
[facility] Provider Note   CSN: [id number] Arrival date & time: 05/09/18  [id number]     History   Chief Complaint Chief Complaint  Patient presents with  . Migraine  . Abdominal Pain    HPI Zoe Miles is a 42 y.o. female.  HPI   42 year old female with complaints.  Primarily right-sided headache.  Centered around right temporal region.  She has a past history of what she calls migraines.  This headache has been waxing and waning for the past week or so.  Typically her headaches do not last this long.  Nausea.  No vomiting.  No fevers or chills.  Some photophobia.  No change in visual acuity.  No acute numbness, tingling or focal loss of strength.  She is also been having some lower abdominal pain/discomfort with radiation into her back.  This has been ongoing for about a week as well.  This pain tends to come and go.  She has not noticed any appreciable exacerbating factors.  No urinary complaints.  No unusual vaginal bleeding or discharge.  Past Medical History:  Diagnosis Date  . Clotting disorder (HCC)   . History of blood clots   . Kidney stones   . Lupus [facility](HCC)     Patient Active Problem List   Diagnosis Date Noted  . Routine general medical examination at a health care facility 11/20/2017  . Systemic lupus erythematosus (HCC) 11/20/2017  . Lupus anticoagulant with hypercoagulable state (HCC) 11/20/2017  . Eczema of tip of finger 11/20/2017    Past Surgical History:  Procedure Laterality Date  . FINGER SURGERY    . None       OB History   None      Home Medications    Prior to Admission medications   Medication Sig Start Date End Date Taking? Authorizing Provider  Crisaborole (EUCRISA) 2 % OINT Apply 1 Act topically 2 (two) times daily. Patient taking differently: Apply 1 application topically 2 (two) times daily as needed (rash).  11/20/17  Yes Etta GrandchildJones, Thomas L, MD  cycloSPORINE (RESTASIS) 0.05 % ophthalmic  emulsion Place 1 drop into both eyes 2 (two) times daily. 08/11/16  Yes [provider]  hydroxychloroquine (PLAQUENIL) 200 MG tablet Take 200 mg by mouth 2 (two) times daily.    Yes [provider]  norethindrone (MICRONOR,CAMILA,ERRIN) 0.35 MG tablet Take 1 tablet by mouth daily.   Yes [provider]  Omega-3 Fatty Acids (FISH OIL PO) Take 1 capsule by mouth daily.    Yes [provider]  rivaroxaban (XARELTO) 20 MG TABS tablet Take 20 mg by mouth daily with supper.   Yes [provider]    Family History Family History  Problem Relation Age of Onset  . Healthy Mother   . Hypertension Father   . Hypercholesterolemia Father   . Diabetes Father   . Colon cancer Maternal Aunt   . Diverticulitis Paternal Uncle   . Breast cancer Paternal Grandmother   . Cancer Paternal Grandmother   . Lung cancer Paternal Grandfather   . Cancer Paternal Grandfather   . Heart disease Maternal Grandfather   . Diabetes Maternal Grandmother     Social History Social History   Tobacco Use  . Smoking status: Never Smoker  . Smokeless tobacco: Never Used  Substance Use Topics  . Alcohol use: Yes    Comment: socially very rare  . Drug use: No     Allergies   Ciprofloxacin  Review of Systems Review of Systems  All systems reviewed and negative, other than as noted in HPI.  Physical Exam Updated Vital Signs BP 112/80   Pulse 90   Temp 98 F (36.7 C) (Oral)   Resp 20   SpO2 100%   Physical Exam  Constitutional: She appears well-developed and well-nourished. No distress.  HENT:  Head: Normocephalic and atraumatic.  Eyes: Conjunctivae are normal. Right eye exhibits no discharge. Left eye exhibits no discharge.  Neck: Neck supple.  Cardiovascular: Normal rate, regular rhythm and normal heart sounds. Exam reveals no gallop and no friction rub.  No murmur heard. Pulmonary/Chest: Effort normal and breath sounds normal. No respiratory distress.    Abdominal: Soft. She exhibits no distension. There is no tenderness.  Soft.  Mild tenderness across the lower abdomen without laterality.  No rebound guarding.  Musculoskeletal: She exhibits no edema or tenderness.  Neurological: She is alert.  Skin: Skin is warm and dry.  Psychiatric: She has a normal mood and affect. Her behavior is normal. Thought content normal.  Nursing note and vitals reviewed.    ED Treatments / Results  Labs (all labs ordered are listed, but only abnormal results are displayed) Labs Reviewed  COMPREHENSIVE METABOLIC PANEL - Abnormal; Notable for the following components:      Result Value   AST 14 (*)    Alkaline Phosphatase 31 (*)    All other components within normal limits  LIPASE, BLOOD  CBC  I-STAT BETA HCG BLOOD, ED ([facility], [facility], [facility] ONLY)    EKG None  Radiology No results found.  Procedures Procedures (including critical care time)  Medications Ordered in ED Medications  sodium chloride 0.9 % bolus 1,000 mL (0 mLs Intravenous Stopped 05/09/18 1324)  ketorolac (TORADOL) 15 MG/ML injection 15 mg (15 mg Intravenous Given 05/09/18 1119)  metoCLOPramide (REGLAN) injection 10 mg (10 mg Intravenous Given 05/09/18 1119)  diphenhydrAMINE (BENADRYL) injection 12.5 mg (12.5 mg Intravenous Given 05/09/18 1119)  iohexol (OMNIPAQUE) 300 MG/ML solution 100 mL (100 mLs Intravenous Contrast Given 05/09/18 1145)     Initial Impression / Assessment and Plan / ED Course  I have reviewed the triage vital signs and the nursing notes.  Pertinent labs & imaging results that were available during my care of the patient were reviewed by me and considered in my medical decision making (see chart for details).     42 year old female with headache.  Seems consistent with a migraine.  Improved with meds.  No particular concerning red flags noted.  Neuro exam is nonfocal.  She is afebrile.  No nuchal rigidity.  With gradual abdominal pain, I doubt emergent process.  She is  advised of the malpositioned IUD.  Close GYN follow-up with regards to this.  Emergent return precautions were discussed.  Final Clinical Impressions(s) / ED Diagnoses   Final diagnoses:  Other migraine without status migrainosus, not intractable  Lower abdominal pain    ED Discharge Orders    None      Raeford Razor, MD 05/12/18 1045

## 2018-05-28 ENCOUNTER — Other Ambulatory Visit: Payer: Self-pay | Admitting: Internal Medicine

## 2018-05-28 ENCOUNTER — Telehealth: Payer: Self-pay | Admitting: Internal Medicine

## 2018-05-28 DIAGNOSIS — D6862 Lupus anticoagulant syndrome: Secondary | ICD-10-CM

## 2018-05-28 MED ORDER — RIVAROXABAN 20 MG PO TABS: 20.0000 mg | ORAL_TABLET | Freq: Every day | ORAL | 1 refills | Status: DC

## 2018-05-28 NOTE — Telephone Encounter (Signed)
Pls advise if ok to refill../lmb 

## 2018-05-28 NOTE — Telephone Encounter (Signed)
MD approved and sent electronically to pof../lmb  

## 2018-05-28 NOTE — Telephone Encounter (Signed)
xarelto refill Last Refill:09/14/17 # not documented Last OV: 09/14/17 PCP: Sanda Lingerhomas Jones, MD [facility] [place]

## 2018-05-28 NOTE — Telephone Encounter (Signed)
RX sent

## 2018-05-28 NOTE — Telephone Encounter (Signed)
Copied from CRM [phone]. Topic: Quick Communication - Rx Refill/Question >> May 28, 2018 10:13 AM Oneal GroutSebastian, Jennifer S wrote: Medication: rivaroxaban (XARELTO) 20 MG TABS tablet   Has the patient contacted their pharmacy? Yes.   (Agent: If no, request that the patient contact the pharmacy for the refill.) (Agent: If yes, when and what did the pharmacy advise?)  Preferred Pharmacy (with phone number or street name): [facility] [place]  Agent: Please be advised that RX refills may take up to 3 business days. We ask that you follow-up with your pharmacy.

## 2018-05-29 ENCOUNTER — Other Ambulatory Visit: Payer: Self-pay | Admitting: General Practice

## 2018-05-29 ENCOUNTER — Ambulatory Visit (INDEPENDENT_AMBULATORY_CARE_PROVIDER_SITE_OTHER): Payer: 59 | Admitting: General Practice

## 2018-05-29 ENCOUNTER — Telehealth: Payer: Self-pay | Admitting: Internal Medicine

## 2018-05-29 DIAGNOSIS — D6862 Lupus anticoagulant syndrome: Secondary | ICD-10-CM

## 2018-05-29 DIAGNOSIS — Z7901 Long term (current) use of anticoagulants: Secondary | ICD-10-CM | POA: Diagnosis not present

## 2018-05-29 LAB — POCT INR: INR: 1 — AB (ref 2.0–3.0)

## 2018-05-29 MED ORDER — WARFARIN SODIUM 5 MG PO TABS: ORAL_TABLET | ORAL | 0 refills | Status: DC

## 2018-05-29 NOTE — Telephone Encounter (Signed)
Arline AspCindy -  This patient needs to be transitioned from Xarelto to Coumadin.  There has been a recent change in the package inserts for DOAC's indicating that they are not as effective of Coumadin for the prevention of thrombosis in someone with a history of antiphospholipid syndrome.  Can you call her and set her up for an appointment with the Coumadin clinic?  Thank you.  Leitha Schuller. Maddon Horton

## 2018-05-29 NOTE — Progress Notes (Signed)
I have reviewed and agree with note, evaluation, plan.   Jenene Kauffmann, MD  

## 2018-05-29 NOTE — Telephone Encounter (Signed)
Certainly

## 2018-05-29 NOTE — Patient Instructions (Signed)
Pre visit review using our clinic review tool, if applicable. No additional management support is needed unless otherwise documented below in the visit note. 

## 2018-06-05 ENCOUNTER — Ambulatory Visit (INDEPENDENT_AMBULATORY_CARE_PROVIDER_SITE_OTHER): Payer: 59 | Admitting: General Practice

## 2018-06-05 DIAGNOSIS — Z7901 Long term (current) use of anticoagulants: Secondary | ICD-10-CM

## 2018-06-05 DIAGNOSIS — D6862 Lupus anticoagulant syndrome: Secondary | ICD-10-CM | POA: Diagnosis not present

## 2018-06-05 LAB — POCT INR: INR: 2.2 (ref 2.0–3.0)

## 2018-06-05 NOTE — Patient Instructions (Addendum)
Pre visit review using our clinic review tool, if applicable. No additional management support is needed unless otherwise documented below in the visit note.  Continue 10 mg daily.  Re-check in 2 weeks. .  A full discussion of the nature of anticoagulants has been carried out.  A benefit risk analysis has been presented to the patient, so that they understand the justification for choosing anticoagulation at this time. The need for frequent and regular monitoring, precise dosage adjustment and compliance is stressed.  Side effects of potential bleeding are discussed.  The patient should avoid any OTC items containing aspirin or ibuprofen, and should avoid great swings in general diet.  Avoid alcohol consumption.  Call if any signs of abnormal bleeding.

## 2018-06-06 NOTE — Progress Notes (Signed)
I have reviewed and agree with this plan  

## 2018-06-17 ENCOUNTER — Ambulatory Visit (INDEPENDENT_AMBULATORY_CARE_PROVIDER_SITE_OTHER): Payer: 59 | Admitting: General Practice

## 2018-06-17 ENCOUNTER — Ambulatory Visit: Payer: 59

## 2018-06-17 DIAGNOSIS — D6862 Lupus anticoagulant syndrome: Secondary | ICD-10-CM

## 2018-06-17 DIAGNOSIS — Z7901 Long term (current) use of anticoagulants: Secondary | ICD-10-CM

## 2018-06-17 LAB — POCT INR: INR: 1.8 — AB (ref 2.0–3.0)

## 2018-06-17 NOTE — Patient Instructions (Addendum)
Pre visit review using our clinic review tool, if applicable. No additional management support is needed unless otherwise documented below in the visit note.  Take 15 mg today (9/16) and then change dosage and take 10 mg daily except 15 mg on Wednesdays.  Re-check in 2 weeks.

## 2018-06-25 ENCOUNTER — Other Ambulatory Visit: Payer: Self-pay | Admitting: General Practice

## 2018-06-25 ENCOUNTER — Other Ambulatory Visit: Payer: Self-pay | Admitting: Internal Medicine

## 2018-06-25 MED ORDER — WARFARIN SODIUM 5 MG PO TABS: ORAL_TABLET | ORAL | 0 refills | Status: DC

## 2018-06-27 ENCOUNTER — Other Ambulatory Visit: Payer: Self-pay | Admitting: Internal Medicine

## 2018-06-27 DIAGNOSIS — D6862 Lupus anticoagulant syndrome: Secondary | ICD-10-CM

## 2018-06-28 DIAGNOSIS — R21 Rash and other nonspecific skin eruption: Secondary | ICD-10-CM | POA: Diagnosis not present

## 2018-06-28 DIAGNOSIS — R760 Raised antibody titer: Secondary | ICD-10-CM | POA: Diagnosis not present

## 2018-06-28 DIAGNOSIS — M329 Systemic lupus erythematosus, unspecified: Secondary | ICD-10-CM | POA: Diagnosis not present

## 2018-07-01 ENCOUNTER — Ambulatory Visit: Payer: 59

## 2018-07-01 ENCOUNTER — Ambulatory Visit (INDEPENDENT_AMBULATORY_CARE_PROVIDER_SITE_OTHER): Payer: 59 | Admitting: General Practice

## 2018-07-01 DIAGNOSIS — D6862 Lupus anticoagulant syndrome: Secondary | ICD-10-CM | POA: Diagnosis not present

## 2018-07-01 DIAGNOSIS — Z7901 Long term (current) use of anticoagulants: Secondary | ICD-10-CM

## 2018-07-01 LAB — POCT INR: INR: 1.8 — AB (ref 2.0–3.0)

## 2018-07-01 NOTE — Patient Instructions (Signed)
Pre visit review using our clinic review tool, if applicable. No additional management support is needed unless otherwise documented below in the visit note.  Change dosage and take 10 mg daily except 15 mg on Monday and Wednesdays and 12.5 mg (2 1/2 tablets) on Fridays.   Re-check in 2 weeks.

## 2018-07-01 NOTE — Progress Notes (Signed)
I have reviewed and agree with this plan  

## 2018-07-15 ENCOUNTER — Ambulatory Visit (INDEPENDENT_AMBULATORY_CARE_PROVIDER_SITE_OTHER): Payer: 59 | Admitting: General Practice

## 2018-07-15 DIAGNOSIS — D6862 Lupus anticoagulant syndrome: Secondary | ICD-10-CM

## 2018-07-15 DIAGNOSIS — Z7901 Long term (current) use of anticoagulants: Secondary | ICD-10-CM | POA: Diagnosis not present

## 2018-07-15 LAB — POCT INR: INR: 1.7 — AB (ref 2.0–3.0)

## 2018-07-15 NOTE — Patient Instructions (Addendum)
Pre visit review using our clinic review tool, if applicable. No additional management support is needed unless otherwise documented below in the visit note.  Take 4 tablets today and then change dose and take 3 tablets daily except 2 tablets on Monday, Wednesday and Friday.

## 2018-07-22 ENCOUNTER — Ambulatory Visit (INDEPENDENT_AMBULATORY_CARE_PROVIDER_SITE_OTHER): Payer: 59 | Admitting: General Practice

## 2018-07-22 DIAGNOSIS — Z7901 Long term (current) use of anticoagulants: Secondary | ICD-10-CM

## 2018-07-22 DIAGNOSIS — D6862 Lupus anticoagulant syndrome: Secondary | ICD-10-CM | POA: Diagnosis not present

## 2018-07-22 LAB — POCT INR: INR: 2.4 (ref 2.0–3.0)

## 2018-07-22 NOTE — Patient Instructions (Signed)
Pre visit review using our clinic review tool, if applicable. No additional management support is needed unless otherwise documented below in the visit note.  Continue to take 3 tablets daily except 2 tablets on Monday, Wednesday and Friday.  Re-check in 3 weeks.

## 2018-07-30 DIAGNOSIS — N898 Other specified noninflammatory disorders of vagina: Secondary | ICD-10-CM | POA: Diagnosis not present

## 2018-07-30 DIAGNOSIS — R102 Pelvic and perineal pain: Secondary | ICD-10-CM | POA: Diagnosis not present

## 2018-07-30 DIAGNOSIS — R8290 Unspecified abnormal findings in urine: Secondary | ICD-10-CM | POA: Diagnosis not present

## 2018-08-12 ENCOUNTER — Ambulatory Visit (INDEPENDENT_AMBULATORY_CARE_PROVIDER_SITE_OTHER): Payer: 59 | Admitting: General Practice

## 2018-08-12 DIAGNOSIS — B9689 Other specified bacterial agents as the cause of diseases classified elsewhere: Secondary | ICD-10-CM | POA: Diagnosis not present

## 2018-08-12 DIAGNOSIS — Z7901 Long term (current) use of anticoagulants: Secondary | ICD-10-CM | POA: Diagnosis not present

## 2018-08-12 DIAGNOSIS — D6862 Lupus anticoagulant syndrome: Secondary | ICD-10-CM

## 2018-08-12 DIAGNOSIS — N760 Acute vaginitis: Secondary | ICD-10-CM | POA: Diagnosis not present

## 2018-08-12 DIAGNOSIS — N898 Other specified noninflammatory disorders of vagina: Secondary | ICD-10-CM | POA: Diagnosis not present

## 2018-08-12 LAB — POCT INR: INR: 3.8 — AB (ref 2.0–3.0)

## 2018-08-12 NOTE — Patient Instructions (Addendum)
Pre visit review using our clinic review tool, if applicable. No additional management support is needed unless otherwise documented below in the visit note.   Please follow patient instructions.  Re-check on 11/22.  11/11 - Last dose of coumadin until after procedure 11/12 - Nothing (No coumadin and No Lovenox) 11/13 - Lovenox in the AM and PM (12 hours apart) 11/14 - Lovenox in the AM and PM 11/15 - Lovenox in the AM only 11/16 - Procedure (No Lovenox today) 11/17 -  Lovenox in the AM and PM and 4 tablets of coumadin 11/18 - Lovenox in the AM and PM and 4 tablets of coumadin 11/19 - Lovenox in the AM and PM and 4 tablets of coumadin 11/20 - Lovenox in the AM and PM and 3 tablets of coumadin 11/21 - Stop Lovenox and continue current dosage of coumadin 11/22 - Check INR at [facility]

## 2018-08-19 ENCOUNTER — Encounter: Payer: Self-pay | Admitting: Internal Medicine

## 2018-08-19 ENCOUNTER — Other Ambulatory Visit (INDEPENDENT_AMBULATORY_CARE_PROVIDER_SITE_OTHER): Payer: 59

## 2018-08-19 ENCOUNTER — Ambulatory Visit (INDEPENDENT_AMBULATORY_CARE_PROVIDER_SITE_OTHER): Payer: 59 | Admitting: Internal Medicine

## 2018-08-19 VITALS — BP 118/82 | HR 79 | Temp 98.4°F | Ht 67.0 in | Wt 225.0 lb

## 2018-08-19 DIAGNOSIS — N200 Calculus of kidney: Secondary | ICD-10-CM | POA: Insufficient documentation

## 2018-08-19 DIAGNOSIS — N3000 Acute cystitis without hematuria: Secondary | ICD-10-CM

## 2018-08-19 DIAGNOSIS — R760 Raised antibody titer: Secondary | ICD-10-CM | POA: Insufficient documentation

## 2018-08-19 DIAGNOSIS — Z7901 Long term (current) use of anticoagulants: Secondary | ICD-10-CM | POA: Diagnosis not present

## 2018-08-19 DIAGNOSIS — Z86718 Personal history of other venous thrombosis and embolism: Secondary | ICD-10-CM

## 2018-08-19 HISTORY — DX: Raised antibody titer: R76.0

## 2018-08-19 HISTORY — DX: Personal history of other venous thrombosis and embolism: Z86.718

## 2018-08-19 LAB — URINALYSIS, ROUTINE W REFLEX MICROSCOPIC
Bilirubin Urine: NEGATIVE
Hgb urine dipstick: NEGATIVE
Ketones, ur: NEGATIVE
Leukocytes, UA: NEGATIVE
Nitrite: NEGATIVE
RBC / HPF: NONE SEEN (ref 0–?)
Specific Gravity, Urine: 1.01 (ref 1.000–1.030)
Total Protein, Urine: NEGATIVE
Urine Glucose: NEGATIVE
Urobilinogen, UA: 0.2 (ref 0.0–1.0)
pH: 6 (ref 5.0–8.0)

## 2018-08-19 MED ORDER — LEVOFLOXACIN 500 MG PO TABS: 500.0000 mg | ORAL_TABLET | Freq: Every day | ORAL | 0 refills | Status: AC

## 2018-08-19 NOTE — Patient Instructions (Signed)
Please take all new medication as prescribed - the levaquin  Please continue all other medications as before, and refills have been done if requested.  Please have the pharmacy call with any other refills you may need.  Please keep your appointments with your specialists as you may have planned  Please go to the LAB in the Basement (turn left off the elevator) for the tests to be done today  You will be contacted by phone if any changes need to be made immediately.  Otherwise, you will receive a letter about your results with an explanation, but please check with MyChart first.  Please remember to sign up for MyChart if you have not done so, as this will be important to you in the future with finding out test results, communicating by private email, and scheduling acute appointments online when needed.

## 2018-08-19 NOTE — Progress Notes (Signed)
Subjective:    Patient ID: Zoe Miles, female    DOB: July 08, 1976, 42 y.o.   MRN: [id number]  HPI    Here with acute GU complaints- has Hx of recent UTI about 2 wks ago after none for many years.  Initial rx changed to augmentin x 5 days after culture result and seemed better overall even resolved for a few days, but now worse again x 2-3 days with low abd "pressure" and frequency  And right flank dull discomfort, but denies urinary symptoms such as dysuria, urgency, hematuria or n/v, fever, chills. Has hx of renal stone but none active recent.  S/p IUD placed but then replaced AUG 2019 with f/u exam neg.  Not pregnant.   Pt denies chest pain, increased sob or doe, wheezing, orthopnea, PND, increased LE swelling, palpitations, dizziness or syncope.   Pt denies polydipsia, polyuria Past Medical History:  Diagnosis Date  . Anti-cardiolipin antibody positive 08/19/2018  . Clotting disorder (HCC)   . History of blood clots   . History of DVT of lower extremity 08/19/2018  . Kidney stones   . Lupus [facility])    Past Surgical History:  Procedure Laterality Date  . FINGER SURGERY    . kindey stone removal with ureteroscopy    . None      reports that she has never smoked. She has never used smokeless tobacco. She reports that she drinks alcohol. She reports that she does not use drugs. family history includes Breast cancer in her paternal grandmother; Cancer in her paternal grandfather and paternal grandmother; Colon cancer in her maternal aunt; Diabetes in her father and maternal grandmother; Diverticulitis in her paternal uncle; Healthy in her mother; Heart disease in her maternal grandfather; Hypercholesterolemia in her father; Hypertension in her father; Lung cancer in her paternal grandfather. Allergies  Allergen Reactions  . Ciprofloxacin Nausea And Vomiting    Pt experiences diarrhea nausea and vomiting   Current Outpatient Medications on File Prior to Visit  Medication Sig Dispense  Refill  . Crisaborole (EUCRISA) 2 % OINT Apply 1 Act topically 2 (two) times daily. (Patient taking differently: Apply 1 application topically 2 (two) times daily as needed (rash). ) 60 g 5  . cycloSPORINE (RESTASIS) 0.05 % ophthalmic emulsion Place 1 drop into both eyes 2 (two) times daily.    . hydroxychloroquine (PLAQUENIL) 200 MG tablet Take 200 mg by mouth 2 (two) times daily.     . norethindrone (MICRONOR,CAMILA,ERRIN) 0.35 MG tablet Take 1 tablet by mouth daily.    . Omega-3 Fatty Acids (FISH OIL PO) Take 1 capsule by mouth daily.     Marland Kitchen warfarin (COUMADIN) 5 MG tablet Take 2 tablets daily except take 3 tablets on Wednesdays or AS DIRECTED BY ANTICOAGULATION CLINIC 195 tablet 0   No current facility-administered medications on file prior to visit.    Review of Systems  Constitutional: Negative for other unusual diaphoresis or sweats HENT: Negative for ear discharge or swelling Eyes: Negative for other worsening visual disturbances Respiratory: Negative for stridor or other swelling  Gastrointestinal: Negative for worsening distension or other blood Genitourinary: Negative for retention or other urinary change Musculoskeletal: Negative for other MSK pain or swelling Skin: Negative for color change or other new lesions Neurological: Negative for worsening tremors and other numbness  Psychiatric/Behavioral: Negative for worsening agitation or other fatigue All other system neg per pt    Objective:   Physical Exam BP 118/82   Pulse 79   Temp 98.4 F (  36.9 C) (Oral)   Ht 5\' 7"  (1.702 m)   Wt 225 lb (102.1 kg)   SpO2 94%   BMI 35.24 kg/m  VS noted, mild ill appearing Constitutional: Pt appears in NAD HENT: Head: NCAT.  Right Ear: External ear normal.  Left Ear: External ear normal.  Eyes: . Pupils are equal, round, and reactive to light. Conjunctivae and EOM are normal Nose: without d/c or deformity Neck: Neck supple. Gross normal ROM Cardiovascular: Normal rate and regular  rhythm.   Pulmonary/Chest: Effort normal and breath sounds without rales or wheezing.  Abd:  Soft, ND, + BS, no organomegaly but has low mid abd tender and right flank tender mild Neurological: Pt is alert. At baseline orientation, motor grossly intact Skin: Skin is warm. No rashes, other new lesions, no LE edema Psychiatric: Pt behavior is normal without agitation  No other exam findings Lab Results  Component Value Date   WBC 6.5 05/09/2018   HGB 13.5 05/09/2018   HCT 42.7 05/09/2018   PLT 206 05/09/2018   GLUCOSE 97 05/09/2018   CHOL 113 11/20/2017   TRIG 58.0 11/20/2017   HDL 69.80 11/20/2017   LDLCALC 31 11/20/2017   ALT 13 05/09/2018   AST 14 (L) 05/09/2018   NA 142 05/09/2018   K 3.8 05/09/2018   CL 106 05/09/2018   CREATININE 0.69 05/09/2018   BUN 6 05/09/2018   CO2 28 05/09/2018   INR 3.8 (A) 08/12/2018       Assessment & Plan:

## 2018-08-19 NOTE — Assessment & Plan Note (Signed)
Has right flank pain but low suspicion for active stone at this time,  to f/u any worsening symptoms or concerns

## 2018-08-19 NOTE — Assessment & Plan Note (Signed)
No hematuria, to cont same tx

## 2018-08-19 NOTE — Assessment & Plan Note (Signed)
High suspicion for UTI recurrence after short augmentin course, for urine studies, empiric levaquin (had only nausea and diarrhea with cipro before and willing to try)

## 2018-08-20 ENCOUNTER — Telehealth: Payer: Self-pay

## 2018-08-20 NOTE — Telephone Encounter (Signed)
-----   Message from Corwin LevinsJames W John, MD sent at 08/19/2018  5:27 PM EST ----- Letter not sent  OK to let pt know that elevated WBC in the urine is c/w at least mild infection, and the culture will take several days to return, ok to cont same tx

## 2018-08-20 NOTE — Telephone Encounter (Signed)
Called pt, LVM.   CRM created.  

## 2018-08-20 NOTE — Telephone Encounter (Signed)
error 

## 2018-08-21 ENCOUNTER — Encounter: Payer: Self-pay | Admitting: Internal Medicine

## 2018-08-21 LAB — URINE CULTURE
MICRO NUMBER:: 91385939
SPECIMEN QUALITY:: ADEQUATE

## 2018-08-22 ENCOUNTER — Telehealth: Payer: Self-pay

## 2018-08-22 MED ORDER — FLUCONAZOLE 150 MG PO TABS: ORAL_TABLET | ORAL | 1 refills | Status: DC

## 2018-08-22 NOTE — Telephone Encounter (Signed)
Done erx 

## 2018-08-22 NOTE — Addendum Note (Signed)
Addended by: Corwin LevinsJOHN, Derell Bruun W on: 08/22/2018 02:27 PM   Modules accepted: Orders

## 2018-08-22 NOTE — Telephone Encounter (Signed)
Pt has been informed of results and expressed understanding.  °

## 2018-08-22 NOTE — Telephone Encounter (Signed)
Copied from CRM [phone]. Topic: General - Other >> Aug 22, 2018 11:09 AM Marylen PontoMcneil, Ja-Kwan wrote: Reason for CRM: Pt called in to speak with Cayden Rautio. Pt stated she has developed a yeast infection from the antibiotics and would like Kinzly Pierrelouis to call her back. Cb# [phone]

## 2018-08-23 ENCOUNTER — Ambulatory Visit (INDEPENDENT_AMBULATORY_CARE_PROVIDER_SITE_OTHER): Payer: 59 | Admitting: General Practice

## 2018-08-23 DIAGNOSIS — Z7901 Long term (current) use of anticoagulants: Secondary | ICD-10-CM | POA: Diagnosis not present

## 2018-08-23 DIAGNOSIS — D6862 Lupus anticoagulant syndrome: Secondary | ICD-10-CM

## 2018-08-23 LAB — POCT INR: INR: 1.9 — AB (ref 2.0–3.0)

## 2018-08-23 NOTE — Patient Instructions (Signed)
Pre visit review using our clinic review tool, if applicable. No additional management support is needed unless otherwise documented below in the visit note.  Take 3 tablets today and then take 2 tablets daily and re-check on 12/2.  Patient is taking Levaquin and will be taking at least 2 days of diflucan.

## 2018-09-02 ENCOUNTER — Other Ambulatory Visit (INDEPENDENT_AMBULATORY_CARE_PROVIDER_SITE_OTHER): Payer: 59

## 2018-09-02 ENCOUNTER — Ambulatory Visit (INDEPENDENT_AMBULATORY_CARE_PROVIDER_SITE_OTHER): Payer: 59 | Admitting: General Practice

## 2018-09-02 ENCOUNTER — Ambulatory Visit: Payer: 59 | Admitting: General Practice

## 2018-09-02 ENCOUNTER — Other Ambulatory Visit: Payer: Self-pay | Admitting: Internal Medicine

## 2018-09-02 ENCOUNTER — Ambulatory Visit: Payer: 59

## 2018-09-02 DIAGNOSIS — N3000 Acute cystitis without hematuria: Secondary | ICD-10-CM | POA: Diagnosis not present

## 2018-09-02 DIAGNOSIS — D6862 Lupus anticoagulant syndrome: Secondary | ICD-10-CM

## 2018-09-02 DIAGNOSIS — Z7901 Long term (current) use of anticoagulants: Secondary | ICD-10-CM

## 2018-09-02 LAB — URINALYSIS, ROUTINE W REFLEX MICROSCOPIC
Bilirubin Urine: NEGATIVE
Hgb urine dipstick: NEGATIVE
Ketones, ur: NEGATIVE
Leukocytes, UA: NEGATIVE
Nitrite: NEGATIVE
RBC / HPF: NONE SEEN (ref 0–?)
Specific Gravity, Urine: 1.025 (ref 1.000–1.030)
Total Protein, Urine: NEGATIVE
Urine Glucose: NEGATIVE
Urobilinogen, UA: 0.2 (ref 0.0–1.0)
pH: 6 (ref 5.0–8.0)

## 2018-09-02 LAB — POCT INR: INR: 1.4 — AB (ref 2.0–3.0)

## 2018-09-02 NOTE — Patient Instructions (Addendum)
Pre visit review using our clinic review tool, if applicable. No additional management support is needed unless otherwise documented below in the visit note.  Take 3 tablets today (12/2) and take 4 tablets tomorrow (12/3) and then continue to take 3 tablets daily except 2 tablets on Monday/Wed/Friday.  Re-check in 2 weeks.  Patient has finished Levaquin.

## 2018-09-04 LAB — CULTURE, URINE COMPREHENSIVE
MICRO NUMBER:: 91439394
SPECIMEN QUALITY:: ADEQUATE

## 2018-09-16 ENCOUNTER — Ambulatory Visit: Payer: 59

## 2018-09-16 ENCOUNTER — Ambulatory Visit (INDEPENDENT_AMBULATORY_CARE_PROVIDER_SITE_OTHER): Payer: 59 | Admitting: General Practice

## 2018-09-16 ENCOUNTER — Other Ambulatory Visit: Payer: Self-pay | Admitting: General Practice

## 2018-09-16 ENCOUNTER — Telehealth: Payer: Self-pay | Admitting: General Practice

## 2018-09-16 DIAGNOSIS — Z7901 Long term (current) use of anticoagulants: Secondary | ICD-10-CM | POA: Diagnosis not present

## 2018-09-16 DIAGNOSIS — D6862 Lupus anticoagulant syndrome: Secondary | ICD-10-CM

## 2018-09-16 LAB — POCT INR: INR: 2.8 (ref 2.0–3.0)

## 2018-09-16 MED ORDER — WARFARIN SODIUM 5 MG PO TABS: ORAL_TABLET | ORAL | 0 refills | Status: DC

## 2018-09-16 MED ORDER — ENOXAPARIN SODIUM 100 MG/ML ~~LOC~~ SOLN: 100.0000 mg | Freq: Two times a day (BID) | SUBCUTANEOUS | 0 refills | Status: DC

## 2018-09-16 NOTE — Patient Instructions (Addendum)
Pre visit review using our clinic review tool, if applicable. No additional management support is needed unless otherwise documented below in the visit note.  Continue to take 3 tablets daily except 2 tablets on Monday/Wed/Friday.  Please follow patient instructions.  Instructions for Lovenox bridge for procedure on 10/05/2018.  12/30 - Last dose of coumadin until after procedure 12/31 - Nothing (No coumadin or Lovenox today 1/1 - Lovenox in the AM and PM 1/2 - Lovenox in the AM and PM 1/3 - Lovenox in the AM only 1/4 - Procedure - No Lovenox today 1/5 - Lovenox in the AM and PM AND 4 1/2 tablets of coumadin (22.5 mg) 1/6 - Lovenox in the AM and PM AND 4 1/2 tablets of coumadin (22.5 mg) 1/7 - Lovenox in the AM and PM AND 3 tablets of coumadin (15 mg) 1/8 - Lovenox in the AM and PM AND 3 tablets of coumadin (15 mg) 1/9 - Lovenox in the AM and PM AND 3 tablets of coumadin (15 mg) 1/10 - [facility]

## 2018-09-16 NOTE — Telephone Encounter (Signed)
-----   Message from Etta Grandchildhomas L Jones, MD sent at 09/16/2018 12:53 PM EST ----- Regarding: FW: Lovenox bridge YES  ----- Message ----- From: Garrison ColumbusBoyd, Rylin Saez D, RN Sent: 09/16/2018  12:42 PM EST To: Etta Grandchildhomas L Jones, MD Subject: Lovenox bridge                                 Dr. Yetta BarreJones,  Patient is having a cosmetic procedure and will need to stop coumadin for 5 days.  OK for Lovenox bridge?  Please advise.  DX - Lupus anticoagulant         [facility]  Thanks, Bailey Mechindy Mikey Maffett, RN

## 2018-09-16 NOTE — Progress Notes (Signed)
I have reviewed and agree with this plan  

## 2018-09-17 ENCOUNTER — Other Ambulatory Visit: Payer: Self-pay | Admitting: Internal Medicine

## 2018-10-07 DIAGNOSIS — M9903 Segmental and somatic dysfunction of lumbar region: Secondary | ICD-10-CM | POA: Diagnosis not present

## 2018-10-11 ENCOUNTER — Ambulatory Visit (INDEPENDENT_AMBULATORY_CARE_PROVIDER_SITE_OTHER): Payer: BLUE CROSS/BLUE SHIELD | Admitting: General Practice

## 2018-10-11 DIAGNOSIS — Z7901 Long term (current) use of anticoagulants: Secondary | ICD-10-CM

## 2018-10-11 DIAGNOSIS — D6862 Lupus anticoagulant syndrome: Secondary | ICD-10-CM

## 2018-10-11 LAB — POCT INR: INR: 1.5 — AB (ref 2.0–3.0)

## 2018-10-11 NOTE — Patient Instructions (Signed)
Pre visit review using our clinic review tool, if applicable. No additional management support is needed unless otherwise documented below in the visit note.  Take 15 mg today and tomorrow (1/10 and 11/11) and then continue to take 3 tablets daily except 2 tablets on Monday/Wed/Friday.  Re-check on 1/20 at [place]. Take Lovenox today and tomorrow.

## 2018-10-21 ENCOUNTER — Ambulatory Visit (INDEPENDENT_AMBULATORY_CARE_PROVIDER_SITE_OTHER): Payer: BLUE CROSS/BLUE SHIELD | Admitting: General Practice

## 2018-10-21 DIAGNOSIS — Z7901 Long term (current) use of anticoagulants: Secondary | ICD-10-CM

## 2018-10-21 DIAGNOSIS — M9903 Segmental and somatic dysfunction of lumbar region: Secondary | ICD-10-CM | POA: Diagnosis not present

## 2018-10-21 DIAGNOSIS — D6862 Lupus anticoagulant syndrome: Secondary | ICD-10-CM

## 2018-10-21 LAB — POCT INR: INR: 2.6 (ref 2.0–3.0)

## 2018-10-21 NOTE — Patient Instructions (Addendum)
Pre visit review using our clinic review tool, if applicable. No additional management support is needed unless otherwise documented below in the visit note.  Continue to take 3 tablets daily except 2 tablets on Monday/Wed/Friday.  Re-check in 4 weeks.

## 2018-10-28 DIAGNOSIS — M9903 Segmental and somatic dysfunction of lumbar region: Secondary | ICD-10-CM | POA: Diagnosis not present

## 2018-11-01 DIAGNOSIS — J0140 Acute pansinusitis, unspecified: Secondary | ICD-10-CM | POA: Diagnosis not present

## 2018-11-18 ENCOUNTER — Ambulatory Visit: Payer: BLUE CROSS/BLUE SHIELD

## 2018-11-18 ENCOUNTER — Ambulatory Visit (INDEPENDENT_AMBULATORY_CARE_PROVIDER_SITE_OTHER): Payer: BLUE CROSS/BLUE SHIELD | Admitting: General Practice

## 2018-11-18 DIAGNOSIS — Z7901 Long term (current) use of anticoagulants: Secondary | ICD-10-CM

## 2018-11-18 DIAGNOSIS — D6862 Lupus anticoagulant syndrome: Secondary | ICD-10-CM

## 2018-11-18 LAB — POCT INR: INR: 4.8 — AB (ref 2.0–3.0)

## 2018-11-18 NOTE — Patient Instructions (Addendum)
Pre visit review using our clinic review tool, if applicable. No additional management support is needed unless otherwise documented below in the visit note.  Hold coumadin today and tomorrow and then continue to take 3 tablets daily except 2 tablets on Monday/Wed/Friday.  Re-check in 3 weeks.

## 2018-11-19 DIAGNOSIS — R8290 Unspecified abnormal findings in urine: Secondary | ICD-10-CM | POA: Diagnosis not present

## 2018-11-19 DIAGNOSIS — R102 Pelvic and perineal pain: Secondary | ICD-10-CM | POA: Diagnosis not present

## 2018-11-20 DIAGNOSIS — M9903 Segmental and somatic dysfunction of lumbar region: Secondary | ICD-10-CM | POA: Diagnosis not present

## 2018-12-02 DIAGNOSIS — M9903 Segmental and somatic dysfunction of lumbar region: Secondary | ICD-10-CM | POA: Diagnosis not present

## 2018-12-03 DIAGNOSIS — R102 Pelvic and perineal pain: Secondary | ICD-10-CM | POA: Diagnosis not present

## 2018-12-03 DIAGNOSIS — R8290 Unspecified abnormal findings in urine: Secondary | ICD-10-CM | POA: Diagnosis not present

## 2018-12-09 ENCOUNTER — Ambulatory Visit: Payer: BLUE CROSS/BLUE SHIELD

## 2018-12-11 ENCOUNTER — Other Ambulatory Visit: Payer: Self-pay | Admitting: Internal Medicine

## 2018-12-13 ENCOUNTER — Ambulatory Visit: Payer: BLUE CROSS/BLUE SHIELD

## 2018-12-25 ENCOUNTER — Ambulatory Visit (INDEPENDENT_AMBULATORY_CARE_PROVIDER_SITE_OTHER): Payer: BLUE CROSS/BLUE SHIELD | Admitting: General Practice

## 2018-12-25 ENCOUNTER — Other Ambulatory Visit: Payer: Self-pay

## 2018-12-25 DIAGNOSIS — Z7901 Long term (current) use of anticoagulants: Secondary | ICD-10-CM

## 2018-12-25 DIAGNOSIS — D6862 Lupus anticoagulant syndrome: Secondary | ICD-10-CM

## 2018-12-25 LAB — POCT INR: INR: 4.2 — AB (ref 2.0–3.0)

## 2018-12-25 NOTE — Progress Notes (Signed)
I have reviewed and agree with this plan  

## 2018-12-25 NOTE — Patient Instructions (Incomplete)
Pre visit review using our clinic review tool, if applicable. No additional management support is needed unless otherwise documented below in the visit note.  Hold coumadin today and take 1 1/2 tablets or (7.5) mg tomorrow.  Change over all dosage and take 2 tablets daily except 3 tablets on Sunday and Thursdays.  Re-check in 3 weeks.  Patient denies extra doses of coumadin, medication changes or change in appetite.  Encouraged patient to report unusual bleeding or bruising.  Also encouraged patient to use a weekly pill box.  Reiterated the risks of a supra therapeutic INR.  Patient verbalizes understanding.

## 2018-12-26 DIAGNOSIS — M9903 Segmental and somatic dysfunction of lumbar region: Secondary | ICD-10-CM | POA: Diagnosis not present

## 2019-01-02 DIAGNOSIS — M329 Systemic lupus erythematosus, unspecified: Secondary | ICD-10-CM | POA: Diagnosis not present

## 2019-01-02 DIAGNOSIS — R760 Raised antibody titer: Secondary | ICD-10-CM | POA: Diagnosis not present

## 2019-01-02 DIAGNOSIS — R21 Rash and other nonspecific skin eruption: Secondary | ICD-10-CM | POA: Diagnosis not present

## 2019-01-06 DIAGNOSIS — M329 Systemic lupus erythematosus, unspecified: Secondary | ICD-10-CM | POA: Diagnosis not present

## 2019-01-14 DIAGNOSIS — M9903 Segmental and somatic dysfunction of lumbar region: Secondary | ICD-10-CM | POA: Diagnosis not present

## 2019-01-15 ENCOUNTER — Other Ambulatory Visit: Payer: Self-pay

## 2019-01-15 ENCOUNTER — Ambulatory Visit (INDEPENDENT_AMBULATORY_CARE_PROVIDER_SITE_OTHER): Payer: BLUE CROSS/BLUE SHIELD | Admitting: General Practice

## 2019-01-15 DIAGNOSIS — D6862 Lupus anticoagulant syndrome: Secondary | ICD-10-CM

## 2019-01-15 DIAGNOSIS — Z7901 Long term (current) use of anticoagulants: Secondary | ICD-10-CM | POA: Diagnosis not present

## 2019-01-15 LAB — POCT INR: INR: 1.6 — AB (ref 2.0–3.0)

## 2019-01-15 NOTE — Patient Instructions (Signed)
Pre visit review using our clinic review tool, if applicable. No additional management support is needed unless otherwise documented below in the visit note.  Take 3 tablets today and then start taking 2 tablets daily 3 tablets on Sunday Tues and Thursdays.  Re-check in 2 to 3 weeks.

## 2019-01-15 NOTE — Progress Notes (Signed)
I have reviewed the results and agree with this plan   

## 2019-01-29 ENCOUNTER — Ambulatory Visit (INDEPENDENT_AMBULATORY_CARE_PROVIDER_SITE_OTHER): Payer: BLUE CROSS/BLUE SHIELD | Admitting: General Practice

## 2019-01-29 DIAGNOSIS — D6862 Lupus anticoagulant syndrome: Secondary | ICD-10-CM

## 2019-01-29 DIAGNOSIS — Z7901 Long term (current) use of anticoagulants: Secondary | ICD-10-CM

## 2019-01-29 NOTE — Patient Instructions (Incomplete)
Pre visit review using our clinic review tool, if applicable. No additional management support is needed unless otherwise documented below in the visit note. 

## 2019-01-31 DIAGNOSIS — D2272 Melanocytic nevi of left lower limb, including hip: Secondary | ICD-10-CM | POA: Diagnosis not present

## 2019-01-31 DIAGNOSIS — L2089 Other atopic dermatitis: Secondary | ICD-10-CM | POA: Diagnosis not present

## 2019-01-31 DIAGNOSIS — D2261 Melanocytic nevi of right upper limb, including shoulder: Secondary | ICD-10-CM | POA: Diagnosis not present

## 2019-01-31 DIAGNOSIS — D2262 Melanocytic nevi of left upper limb, including shoulder: Secondary | ICD-10-CM | POA: Diagnosis not present

## 2019-02-04 ENCOUNTER — Ambulatory Visit (INDEPENDENT_AMBULATORY_CARE_PROVIDER_SITE_OTHER): Payer: BLUE CROSS/BLUE SHIELD | Admitting: General Practice

## 2019-02-04 DIAGNOSIS — M9903 Segmental and somatic dysfunction of lumbar region: Secondary | ICD-10-CM | POA: Diagnosis not present

## 2019-02-04 DIAGNOSIS — Z7901 Long term (current) use of anticoagulants: Secondary | ICD-10-CM

## 2019-02-04 DIAGNOSIS — H53453 Other localized visual field defect, bilateral: Secondary | ICD-10-CM | POA: Diagnosis not present

## 2019-02-04 DIAGNOSIS — D6862 Lupus anticoagulant syndrome: Secondary | ICD-10-CM

## 2019-02-04 LAB — POCT INR: INR: 1.3 — AB (ref 2.0–3.0)

## 2019-02-04 NOTE — Patient Instructions (Addendum)
Pre visit review using our clinic review tool, if applicable. No additional management support is needed unless otherwise documented below   Please take 4 tablets today, tomorrow and Thursday and take 3 tablets Friday, Saturday and Sunday and 2 tablets Monday and Tues.  Re-check in 1 week at [facility]

## 2019-02-12 ENCOUNTER — Ambulatory Visit (INDEPENDENT_AMBULATORY_CARE_PROVIDER_SITE_OTHER): Payer: BLUE CROSS/BLUE SHIELD | Admitting: General Practice

## 2019-02-12 ENCOUNTER — Telehealth: Payer: Self-pay | Admitting: Internal Medicine

## 2019-02-12 ENCOUNTER — Ambulatory Visit: Payer: BLUE CROSS/BLUE SHIELD

## 2019-02-12 ENCOUNTER — Other Ambulatory Visit: Payer: Self-pay

## 2019-02-12 DIAGNOSIS — Z7901 Long term (current) use of anticoagulants: Secondary | ICD-10-CM

## 2019-02-12 DIAGNOSIS — D6862 Lupus anticoagulant syndrome: Secondary | ICD-10-CM

## 2019-02-12 LAB — POCT INR: INR: 1.5 — AB (ref 2.0–3.0)

## 2019-02-12 NOTE — Progress Notes (Signed)
I have reviewed this visit and I agree on the patient's plan of dosage and recommendations. Ramon Brant, DO   

## 2019-02-12 NOTE — Telephone Encounter (Signed)
The patient called in wanting to know if she can change her appointment time today. There is no opens and she said that you didn't want her to wait till Monday so she would like for you to call her please.

## 2019-02-12 NOTE — Patient Instructions (Signed)
Pre visit review using our clinic review tool, if applicable. No additional management support is needed unless otherwise documented below in the visit note.  Please take 4 tablets today, tomorrow and Friday.  On Saturday start taking 3 tablets daily.  Re-check in 1 week.

## 2019-02-20 DIAGNOSIS — M9903 Segmental and somatic dysfunction of lumbar region: Secondary | ICD-10-CM | POA: Diagnosis not present

## 2019-02-21 ENCOUNTER — Ambulatory Visit (INDEPENDENT_AMBULATORY_CARE_PROVIDER_SITE_OTHER): Payer: BLUE CROSS/BLUE SHIELD | Admitting: General Practice

## 2019-02-21 ENCOUNTER — Other Ambulatory Visit: Payer: Self-pay

## 2019-02-21 DIAGNOSIS — Z7901 Long term (current) use of anticoagulants: Secondary | ICD-10-CM

## 2019-02-21 DIAGNOSIS — D6862 Lupus anticoagulant syndrome: Secondary | ICD-10-CM

## 2019-02-21 LAB — POCT INR: INR: 2.2 (ref 2.0–3.0)

## 2019-02-21 NOTE — Patient Instructions (Addendum)
Pre visit review using our clinic review tool, if applicable. No additional management support is needed unless otherwise documented below in the visit note.  Please continue to take 3 tablets daily.  Re-check 3 weeks.

## 2019-02-27 ENCOUNTER — Other Ambulatory Visit: Payer: Self-pay | Admitting: Internal Medicine

## 2019-02-27 DIAGNOSIS — Z1231 Encounter for screening mammogram for malignant neoplasm of breast: Secondary | ICD-10-CM

## 2019-03-07 ENCOUNTER — Other Ambulatory Visit: Payer: Self-pay | Admitting: Internal Medicine

## 2019-03-12 DIAGNOSIS — M9903 Segmental and somatic dysfunction of lumbar region: Secondary | ICD-10-CM | POA: Diagnosis not present

## 2019-03-14 ENCOUNTER — Other Ambulatory Visit: Payer: Self-pay

## 2019-03-14 ENCOUNTER — Ambulatory Visit (INDEPENDENT_AMBULATORY_CARE_PROVIDER_SITE_OTHER): Payer: BC Managed Care – PPO | Admitting: General Practice

## 2019-03-14 DIAGNOSIS — D6862 Lupus anticoagulant syndrome: Secondary | ICD-10-CM

## 2019-03-14 DIAGNOSIS — Z7901 Long term (current) use of anticoagulants: Secondary | ICD-10-CM

## 2019-03-14 LAB — POCT INR: INR: 2 (ref 2.0–3.0)

## 2019-03-14 NOTE — Patient Instructions (Addendum)
Pre visit review using our clinic review tool, if applicable. No additional management support is needed unless otherwise documented below in the visit note.  Please continue to take 3 tablets daily.  Re-check 4 weeks.

## 2019-04-02 DIAGNOSIS — M9903 Segmental and somatic dysfunction of lumbar region: Secondary | ICD-10-CM | POA: Diagnosis not present

## 2019-04-11 ENCOUNTER — Ambulatory Visit (INDEPENDENT_AMBULATORY_CARE_PROVIDER_SITE_OTHER): Payer: BC Managed Care – PPO | Admitting: General Practice

## 2019-04-11 ENCOUNTER — Other Ambulatory Visit: Payer: Self-pay

## 2019-04-11 DIAGNOSIS — Z7901 Long term (current) use of anticoagulants: Secondary | ICD-10-CM

## 2019-04-11 DIAGNOSIS — D6862 Lupus anticoagulant syndrome: Secondary | ICD-10-CM

## 2019-04-11 LAB — POCT INR: INR: 1.9 — AB (ref 2.0–3.0)

## 2019-04-11 NOTE — Patient Instructions (Signed)
Pre visit review using our clinic review tool, if applicable. No additional management support is needed unless otherwise documented below in the visit note.  Take 4 tablets today and then continue to take 3 tablets daily.  Re-check 4 weeks.

## 2019-04-15 ENCOUNTER — Ambulatory Visit
Admission: RE | Admit: 2019-04-15 | Discharge: 2019-04-15 | Disposition: A | Discharge status: Home / Self Care | Payer: BC Managed Care – PPO | Source: Ambulatory Visit | Attending: Internal Medicine | Admitting: Internal Medicine

## 2019-04-15 ENCOUNTER — Other Ambulatory Visit: Payer: Self-pay

## 2019-04-15 DIAGNOSIS — Z1231 Encounter for screening mammogram for malignant neoplasm of breast: Secondary | ICD-10-CM | POA: Diagnosis not present

## 2019-04-16 LAB — HM MAMMOGRAPHY

## 2019-04-17 DIAGNOSIS — M9903 Segmental and somatic dysfunction of lumbar region: Secondary | ICD-10-CM | POA: Diagnosis not present

## 2019-04-18 NOTE — Progress Notes (Signed)
Medical screening examination/treatment/procedure(s) were performed by non-physician practitioner and as supervising physician I was immediately available for consultation/collaboration. I agree with above. Arica Bevilacqua, MD   

## 2019-04-24 ENCOUNTER — Encounter: Payer: Self-pay | Admitting: Internal Medicine

## 2019-04-24 ENCOUNTER — Other Ambulatory Visit: Payer: Self-pay

## 2019-04-24 ENCOUNTER — Ambulatory Visit (INDEPENDENT_AMBULATORY_CARE_PROVIDER_SITE_OTHER): Payer: BC Managed Care – PPO | Admitting: Internal Medicine

## 2019-04-24 DIAGNOSIS — H6091 Unspecified otitis externa, right ear: Secondary | ICD-10-CM | POA: Diagnosis not present

## 2019-04-24 MED ORDER — NEOMYCIN-POLYMYXIN-HC 3.5-10000-1 OT SUSP: 4.0000 [drp] | Freq: Four times a day (QID) | OTIC | 0 refills | Status: AC

## 2019-04-24 NOTE — Progress Notes (Signed)
Subjective:    Patient ID: Zoe Miles, female    DOB: 1975/11/09, 43 y.o.   MRN: [id number]  HPI  Here with c/o 2 days onset right ear pain with swelling, fulllness, muffled hearing but no ear d/c, fever, chills or sinus congestion.  Pt denies chest pain, increased sob or doe, wheezing, orthopnea, PND, increased LE swelling, palpitations, dizziness or syncope.   Pt denies polydipsia, polyuria 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   Clotting disorder ([place])    History of blood clots    History of DVT of lower extremity 08/19/2018   Kidney stones    Lupus [facility])    Past Surgical History:  Procedure Laterality Date   FINGER SURGERY     kindey stone removal with ureteroscopy     None      reports that she has never smoked. She has never used smokeless tobacco. She reports current alcohol use. She reports that she does not use drugs. family history includes Breast cancer in her paternal grandmother; Cancer in her paternal grandfather and paternal grandmother; Colon cancer in her maternal aunt; Diabetes in her father and maternal grandmother; Diverticulitis in her paternal uncle; Healthy in her mother; Heart disease in her maternal grandfather; Hypercholesterolemia in her father; Hypertension in her father; Lung cancer in her paternal grandfather. Allergies  Allergen Reactions   Ciprofloxacin Nausea And Vomiting    Pt experiences diarrhea nausea and vomiting   Current Outpatient Medications on File Prior to Visit  Medication Sig Dispense Refill   Crisaborole (EUCRISA) 2 % OINT Apply 1 Act topically 2 (two) times daily. (Patient taking differently: Apply 1 application topically 2 (two) times daily as needed (rash). ) 60 g 5   cycloSPORINE (RESTASIS) 0.05 % ophthalmic emulsion Place 1 drop into both eyes 2 (two) times daily.     enoxaparin (LOVENOX) 100 MG/ML injection Inject 1 mL (100 mg total) into the skin every 12  (twelve) hours. 20 Syringe 0   hydroxychloroquine (PLAQUENIL) 200 MG tablet Take 200 mg by mouth 2 (two) times daily.      norethindrone (MICRONOR,CAMILA,ERRIN) 0.35 MG tablet Take 1 tablet by mouth daily.     Omega-3 Fatty Acids (FISH OIL PO) Take 1 capsule by mouth daily.      warfarin (COUMADIN) 5 MG tablet TAKE 3 TABS DAILY ON TUES/THURS/SAT/SUN. AND TAKE 2 TABS ON MON/WED/FRI AS DIRECTED 240 tablet 0   No current facility-administered medications on file prior to visit.    Review of Systems All otherwise neg per pt    Objective:   Physical Exam BP 122/82    Pulse 83    Temp 98.3 F (36.8 C) (Oral)    Ht 5\' 7"  (1.702 m)    Wt 211 lb (95.7 kg)    SpO2 99%    BMI 33.05 kg/m  VS noted,  Constitutional: Pt appears in NAD HENT: Head: NCAT.  Right Ear: External ear normal. Right canal with 1+ red, tender, swelling without d/c Left Ear: External ear normal.  Eyes: . Pupils are equal, round, and reactive to light. Conjunctivae and EOM are normal Nose: without d/c or deformity Neck: Neck supple. Gross normal ROM Cardiovascular: Normal rate and regular rhythm.   Pulmonary/Chest: Effort normal and breath sounds without rales or wheezing.  Neurological: Pt is alert. At baseline orientation, motor grossly intact Skin: Skin is warm. No rashes, other new lesions, no LE edema Psychiatric: Pt behavior is normal without agitation  No other exam findings Lab Results  Component Value Date   WBC 6.5 05/09/2018   HGB 13.5 05/09/2018   HCT 42.7 05/09/2018   PLT 206 05/09/2018   GLUCOSE 97 05/09/2018   CHOL 113 11/20/2017   TRIG 58.0 11/20/2017   HDL 69.80 11/20/2017   LDLCALC 31 11/20/2017   ALT 13 05/09/2018   AST 14 (L) 05/09/2018   NA 142 05/09/2018   K 3.8 05/09/2018   CL 106 05/09/2018   CREATININE 0.69 05/09/2018   BUN 6 05/09/2018   CO2 28 05/09/2018   INR 1.9 (A) 04/11/2019       Assessment & Plan:

## 2019-04-24 NOTE — Assessment & Plan Note (Signed)
Mild to mod, for antibx course,  to f/u any worsening symptoms or concerns 

## 2019-04-24 NOTE — Patient Instructions (Signed)
Please take all new medication as prescribed - the ear drops  Please continue all other medications as before, and refills have been done if requested.  Please have the pharmacy call with any other refills you may need.  Please keep your appointments with your specialists as you may have planned   

## 2019-05-08 DIAGNOSIS — M9903 Segmental and somatic dysfunction of lumbar region: Secondary | ICD-10-CM | POA: Diagnosis not present

## 2019-05-09 ENCOUNTER — Other Ambulatory Visit: Payer: Self-pay

## 2019-05-09 ENCOUNTER — Ambulatory Visit (INDEPENDENT_AMBULATORY_CARE_PROVIDER_SITE_OTHER): Payer: BC Managed Care – PPO | Admitting: General Practice

## 2019-05-09 DIAGNOSIS — Z7901 Long term (current) use of anticoagulants: Secondary | ICD-10-CM

## 2019-05-09 DIAGNOSIS — D6862 Lupus anticoagulant syndrome: Secondary | ICD-10-CM

## 2019-05-09 LAB — POCT INR: INR: 2.6 (ref 2.0–3.0)

## 2019-05-09 NOTE — Patient Instructions (Addendum)
Pre visit review using our clinic review tool, if applicable. No additional management support is needed unless otherwise documented below in the visit note.  Continue to take 3 tablets daily.  Re-check 6 weeks.

## 2019-05-29 DIAGNOSIS — M9903 Segmental and somatic dysfunction of lumbar region: Secondary | ICD-10-CM | POA: Diagnosis not present

## 2019-06-02 ENCOUNTER — Other Ambulatory Visit: Payer: Self-pay | Admitting: Internal Medicine

## 2019-06-08 DIAGNOSIS — R300 Dysuria: Secondary | ICD-10-CM | POA: Diagnosis not present

## 2019-06-08 DIAGNOSIS — N3001 Acute cystitis with hematuria: Secondary | ICD-10-CM | POA: Diagnosis not present

## 2019-06-18 ENCOUNTER — Encounter: Payer: Self-pay | Admitting: Internal Medicine

## 2019-06-18 ENCOUNTER — Ambulatory Visit (INDEPENDENT_AMBULATORY_CARE_PROVIDER_SITE_OTHER): Payer: BC Managed Care – PPO | Admitting: Internal Medicine

## 2019-06-18 ENCOUNTER — Other Ambulatory Visit: Payer: Self-pay

## 2019-06-18 ENCOUNTER — Other Ambulatory Visit: Payer: BC Managed Care – PPO

## 2019-06-18 VITALS — BP 130/74 | HR 77 | Temp 98.3°F | Ht 67.0 in | Wt 206.0 lb

## 2019-06-18 DIAGNOSIS — B9689 Other specified bacterial agents as the cause of diseases classified elsewhere: Secondary | ICD-10-CM

## 2019-06-18 DIAGNOSIS — K648 Other hemorrhoids: Secondary | ICD-10-CM | POA: Diagnosis not present

## 2019-06-18 DIAGNOSIS — N390 Urinary tract infection, site not specified: Secondary | ICD-10-CM

## 2019-06-18 DIAGNOSIS — B961 Klebsiella pneumoniae [K. pneumoniae] as the cause of diseases classified elsewhere: Secondary | ICD-10-CM

## 2019-06-18 LAB — POC URINALSYSI DIPSTICK (AUTOMATED)
Bilirubin, UA: NEGATIVE
Blood, UA: NEGATIVE
Glucose, UA: NEGATIVE
Ketones, UA: NEGATIVE
Leukocytes, UA: NEGATIVE
Nitrite, UA: NEGATIVE
Protein, UA: NEGATIVE
Spec Grav, UA: 1.01 (ref 1.010–1.025)
Urobilinogen, UA: 0.2 E.U./dL
pH, UA: 6 (ref 5.0–8.0)

## 2019-06-18 NOTE — Progress Notes (Signed)
Subjective:  Patient ID: Zoe Miles, female    DOB: 01-01-1976  Age: 43 y.o. MRN: [id number]  CC: Urinary Tract Infection   HPI Zoe Miles presents for she was recently seen at an urgent care center and diagnosed with a Klebsiella UTI.  She is completing a course of Augmentin but continues to complain of urinary frequency.  She denies dysuria, hematuria, flank pain, nausea, vomiting, fever, or chills.  She complains of a several month history of difficulty with hemorrhoids.  She describes discomfort and burning that has not responded to topical agents.  She occasionally notices blood on the toilet paper.  She denies tenesmus.  She has had a few episodes of diarrhea recently.  Outpatient Medications Prior to Visit  Medication Sig Dispense Refill  . amoxicillin-clavulanate (AUGMENTIN) 500-125 MG tablet TAKE 1 TABLET BY MOUTH TWICE A DAY FOR 7 DAYS    . Crisaborole (EUCRISA) 2 % OINT Apply 1 Act topically 2 (two) times daily. (Patient taking differently: Apply 1 application topically 2 (two) times daily as needed (rash). ) 60 g 5  . cycloSPORINE (RESTASIS) 0.05 % ophthalmic emulsion Place 1 drop into both eyes 2 (two) times daily.    Marland Kitchen. enoxaparin (LOVENOX) 100 MG/ML injection Inject 1 mL (100 mg total) into the skin every 12 (twelve) hours. 20 Syringe 0  . hydroxychloroquine (PLAQUENIL) 200 MG tablet Take 200 mg by mouth 2 (two) times daily.     . norethindrone (MICRONOR,CAMILA,ERRIN) 0.35 MG tablet Take 1 tablet by mouth daily.    . Omega-3 Fatty Acids (FISH OIL PO) Take 1 capsule by mouth daily.     Marland Kitchen. warfarin (COUMADIN) 5 MG tablet TAKE 3 TABS DAILY ON TUES/THURS/SAT/SUN. AND TAKE 2 TABS ON MON/WED/FRI AS DIRECTED 240 tablet 0   No facility-administered medications prior to visit.     ROS Review of Systems  Constitutional: Negative for chills, fatigue and fever.  HENT: Negative.   Eyes: Negative.   Respiratory: Negative for cough, chest tightness, shortness of  breath and wheezing.   Cardiovascular: Negative for chest pain, palpitations and leg swelling.  Gastrointestinal: Positive for anal bleeding, diarrhea and rectal pain. Negative for abdominal pain, blood in stool, constipation, nausea and vomiting.  Endocrine: Negative.   Genitourinary: Positive for frequency. Negative for decreased urine volume, difficulty urinating, dysuria, hematuria, urgency, vaginal bleeding and vaginal discharge.  Musculoskeletal: Negative.   Skin: Negative.   Neurological: Negative.  Negative for dizziness, weakness, light-headedness and headaches.  Hematological: Negative for adenopathy. Does not bruise/bleed easily.  Psychiatric/Behavioral: Negative.     Objective:  BP 130/74 (BP Location: Left Arm, Patient Position: Sitting, Cuff Size: Normal)   Pulse 77   Temp 98.3 F (36.8 C) (Oral)   Ht 5\' 7"  (1.702 m)   Wt 206 lb (93.4 kg)   SpO2 97%   BMI 32.26 kg/m   BP Readings from Last 3 Encounters:  06/18/19 130/74  04/24/19 122/82  08/19/18 118/82    Wt Readings from Last 3 Encounters:  06/18/19 206 lb (93.4 kg)  04/24/19 211 lb (95.7 kg)  08/19/18 225 lb (102.1 kg)    Physical Exam Vitals signs reviewed.  Constitutional:      General: She is not in acute distress.    Appearance: She is not ill-appearing, toxic-appearing or diaphoretic.  HENT:     Nose: Nose normal.     Mouth/Throat:     Mouth: Mucous membranes are moist.  Eyes:     General: No scleral icterus.  Conjunctiva/sclera: Conjunctivae normal.  Neck:     Musculoskeletal: Normal range of motion. No neck rigidity or muscular tenderness.  Cardiovascular:     Rate and Rhythm: Normal rate and regular rhythm.     Heart sounds: No murmur.  Pulmonary:     Breath sounds: No stridor. No wheezing, rhonchi or rales.  Abdominal:     General: Abdomen is flat. Bowel sounds are normal. There is no distension.     Palpations: Abdomen is soft. There is no hepatomegaly or splenomegaly.      Tenderness: There is no abdominal tenderness.  Genitourinary:    Rectum: Guaiac result negative. Internal hemorrhoid present. No mass, tenderness, anal fissure or external hemorrhoid. Normal anal tone.     Comments: She has minimal internal hemorrhoidal disease.  There are no fissures, fistulas, abscesses, or bright red blood. Musculoskeletal: Normal range of motion.     Right lower leg: No edema.     Left lower leg: No edema.  Skin:    General: Skin is warm and dry.  Neurological:     General: No focal deficit present.     Lab Results  Component Value Date   WBC 6.5 05/09/2018   HGB 13.5 05/09/2018   HCT 42.7 05/09/2018   PLT 206 05/09/2018   GLUCOSE 97 05/09/2018   CHOL 113 11/20/2017   TRIG 58.0 11/20/2017   HDL 69.80 11/20/2017   LDLCALC 31 11/20/2017   ALT 13 05/09/2018   AST 14 (L) 05/09/2018   NA 142 05/09/2018   K 3.8 05/09/2018   CL 106 05/09/2018   CREATININE 0.69 05/09/2018   BUN 6 05/09/2018   CO2 28 05/09/2018   INR 2.6 05/09/2019    Mm 3d Screen Breast Bilateral  Result Date: 04/16/2019 CLINICAL DATA:  Screening. EXAM: DIGITAL SCREENING BILATERAL MAMMOGRAM WITH TOMO AND CAD COMPARISON:  Previous exam(s). ACR Breast Density Category b: There are scattered areas of fibroglandular density. FINDINGS: There are no findings suspicious for malignancy. Images were processed with CAD. IMPRESSION: No mammographic evidence of malignancy. A result letter of this screening mammogram will be mailed directly to the patient. RECOMMENDATION: Screening mammogram in one year. (Code:SM-B-01Y) BI-RADS CATEGORY  1: Negative. Electronically Signed   By: Lovey Newcomer M.D.   On: 04/16/2019 13:03    Assessment & Plan:   Zoe Miles was seen today for urinary tract infection.  Diagnoses and all orders for this visit:  UTI due to Klebsiella species- Her UA is normal today.  I will recheck her urine culture to be sure that the infection has been adequately treated. -     CULTURE, URINE  COMPREHENSIVE; Future -     POCT Urinalysis Dipstick (Automated)  Inflamed internal hemorrhoid- She has significant symptomatology but the hemorrhoidal burden is not that impressive.  I have asked her to see her gastroenterologist to see if she would benefit from a banding procedure. -     Ambulatory referral to Gastroenterology   I am having Royetta Crochet maintain her hydroxychloroquine, Omega-3 Fatty Acids (FISH OIL PO), Crisaborole, norethindrone, cycloSPORINE, enoxaparin, warfarin, and amoxicillin-clavulanate.  No orders of the defined types were placed in this encounter.    Follow-up: Return if symptoms worsen or fail to improve.  Scarlette Calico, MD

## 2019-06-18 NOTE — Patient Instructions (Signed)

## 2019-06-19 DIAGNOSIS — M9903 Segmental and somatic dysfunction of lumbar region: Secondary | ICD-10-CM | POA: Diagnosis not present

## 2019-06-20 ENCOUNTER — Encounter: Payer: Self-pay | Admitting: Internal Medicine

## 2019-06-20 ENCOUNTER — Other Ambulatory Visit: Payer: Self-pay

## 2019-06-20 ENCOUNTER — Ambulatory Visit (INDEPENDENT_AMBULATORY_CARE_PROVIDER_SITE_OTHER): Payer: BC Managed Care – PPO | Admitting: General Practice

## 2019-06-20 DIAGNOSIS — D6862 Lupus anticoagulant syndrome: Secondary | ICD-10-CM

## 2019-06-20 DIAGNOSIS — Z7901 Long term (current) use of anticoagulants: Secondary | ICD-10-CM | POA: Diagnosis not present

## 2019-06-20 LAB — CULTURE, URINE COMPREHENSIVE
MICRO NUMBER:: 888107
SPECIMEN QUALITY:: ADEQUATE

## 2019-06-20 LAB — POCT INR: INR: 1.8 — AB (ref 2.0–3.0)

## 2019-06-20 NOTE — Patient Instructions (Addendum)
Pre visit review using our clinic review tool, if applicable. No additional management support is needed unless otherwise documented below in the visit note.  Take 4 tablets today and then continue to take 3 tablets daily.  Re-check 4 weeks.

## 2019-06-20 NOTE — Progress Notes (Signed)
Medical screening examination/treatment/procedure(s) were performed by non-physician practitioner and as supervising physician I was immediately available for consultation/collaboration. I agree with above. Rosaleen Mazer, MD   

## 2019-07-01 DIAGNOSIS — Z30432 Encounter for removal of intrauterine contraceptive device: Secondary | ICD-10-CM | POA: Diagnosis not present

## 2019-07-01 DIAGNOSIS — R102 Pelvic and perineal pain: Secondary | ICD-10-CM | POA: Diagnosis not present

## 2019-07-01 DIAGNOSIS — Z3009 Encounter for other general counseling and advice on contraception: Secondary | ICD-10-CM | POA: Diagnosis not present

## 2019-07-07 DIAGNOSIS — M25571 Pain in right ankle and joints of right foot: Secondary | ICD-10-CM | POA: Diagnosis not present

## 2019-07-07 DIAGNOSIS — R21 Rash and other nonspecific skin eruption: Secondary | ICD-10-CM | POA: Diagnosis not present

## 2019-07-07 DIAGNOSIS — M329 Systemic lupus erythematosus, unspecified: Secondary | ICD-10-CM | POA: Diagnosis not present

## 2019-07-07 DIAGNOSIS — R760 Raised antibody titer: Secondary | ICD-10-CM | POA: Diagnosis not present

## 2019-07-10 DIAGNOSIS — M9903 Segmental and somatic dysfunction of lumbar region: Secondary | ICD-10-CM | POA: Diagnosis not present

## 2019-07-16 DIAGNOSIS — M7671 Peroneal tendinitis, right leg: Secondary | ICD-10-CM | POA: Diagnosis not present

## 2019-07-18 ENCOUNTER — Ambulatory Visit: Payer: BC Managed Care – PPO

## 2019-07-21 ENCOUNTER — Other Ambulatory Visit: Payer: Self-pay

## 2019-07-21 ENCOUNTER — Ambulatory Visit (INDEPENDENT_AMBULATORY_CARE_PROVIDER_SITE_OTHER): Payer: BC Managed Care – PPO | Admitting: General Practice

## 2019-07-21 DIAGNOSIS — Z7901 Long term (current) use of anticoagulants: Secondary | ICD-10-CM | POA: Diagnosis not present

## 2019-07-21 DIAGNOSIS — D6862 Lupus anticoagulant syndrome: Secondary | ICD-10-CM

## 2019-07-21 LAB — POCT INR: INR: 3.2 — AB (ref 2.0–3.0)

## 2019-07-21 NOTE — Patient Instructions (Addendum)
Pre visit review using our clinic review tool, if applicable. No additional management support is needed unless otherwise documented below in the visit note.  Take 2  tablets today and then continue to take 3 tablets daily.  Re-check 4 to 6 weeks.

## 2019-07-26 DIAGNOSIS — M25571 Pain in right ankle and joints of right foot: Secondary | ICD-10-CM | POA: Diagnosis not present

## 2019-07-30 ENCOUNTER — Other Ambulatory Visit: Payer: Self-pay

## 2019-07-30 ENCOUNTER — Encounter: Payer: Self-pay | Admitting: Gastroenterology

## 2019-07-30 ENCOUNTER — Ambulatory Visit (INDEPENDENT_AMBULATORY_CARE_PROVIDER_SITE_OTHER): Payer: BC Managed Care – PPO | Admitting: Gastroenterology

## 2019-07-30 VITALS — BP 124/60 | Temp 98.4°F | Ht 67.0 in | Wt 200.4 lb

## 2019-07-30 DIAGNOSIS — K648 Other hemorrhoids: Secondary | ICD-10-CM

## 2019-07-30 DIAGNOSIS — M9903 Segmental and somatic dysfunction of lumbar region: Secondary | ICD-10-CM | POA: Diagnosis not present

## 2019-07-30 NOTE — Patient Instructions (Signed)
We have scheduled your hemorrhoid bandings. Please remain on your coumadin.   Thank you for choosing me and [facility].  Pricilla Riffle. Dagoberto Ligas., MD., Marval Regal

## 2019-07-30 NOTE — Progress Notes (Signed)
    History of Present Illness: This is a 43 year old female who relates hemorrhoidal discomfort and swelling with possible prolapse that lasted about 1 week.  She noted a protruding grape like area that was tender and resolved with Proctofoam.  She has noted intermittent mild bleeding in the past but none recently.  Her symptoms have resolved.  She has experienced symptoms several times over the past few years.  She would like more definitive management.  Colonoscopy 11/2017 - Non-thrombosed external hemorrhoids found on perianal exam. - Small internal hemorrhoids. - The examination was otherwise normal on direct and retroflexion views. - No specimens collected.  Current Medications, Allergies, Past Medical History, Past Surgical History, Family History and Social History were reviewed in [employer] record.   Physical Exam: General: Well developed, well nourished, no acute distress Head: Normocephalic and atraumatic Eyes:  sclerae anicteric, EOMI Ears: Normal auditory acuity Mouth: No deformity or lesions Lungs: Clear throughout to auscultation Heart: Regular rate and rhythm; no murmurs, rubs or bruits Abdomen: Soft, non tender and non distended. No masses, hepatosplenomegaly or hernias noted. Normal Bowel sounds Rectal: External hemorrhoidal tag, no internal lesions, no stool for Hemoccult Musculoskeletal: Symmetrical with no gross deformities  Pulses:  Normal pulses noted Extremities: No clubbing, cyanosis, edema or deformities noted Neurological: Alert oriented x 4, grossly nonfocal Psychological:  Alert and cooperative. Normal mood and affect   Assessment and Recommendations:  1. Inflammed internal hemorrhoids, possible prolapsed internal hemorrhoid or a thrombosed external hemorrhoid that has now resolved.  This is been a recurrent problem.  We discussed hemorrhoidal banding for internal hemorrhoids.  Potentially this was an external thrombosed hemorrhoid  which internal hemorrhoid banding will not help.  She understands the slight increased risk of bleeding while maintained on Coumadin anticoagulation however 3 separate hemorrhoid banding's with Coumadin holds for every banding is a substantial interruption of her long-term anticoagulation.  She agrees to proceed with banding while maintained on Coumadin.  Rectal care instructions. Proctofoam bidprn. RectiCare bid prn. Marland Kitchen

## 2019-07-31 ENCOUNTER — Telehealth: Payer: Self-pay | Admitting: Gastroenterology

## 2019-07-31 NOTE — Telephone Encounter (Signed)
Pt called asking for you, she has some questions about her upcoming appt. Pls call her.

## 2019-07-31 NOTE — Telephone Encounter (Signed)
Patient wants to reschedule her 2nd hemorrhoid banding to 09/08/19. Rescheduled appt and patient verbalized understanding.

## 2019-08-01 DIAGNOSIS — M19071 Primary osteoarthritis, right ankle and foot: Secondary | ICD-10-CM | POA: Diagnosis not present

## 2019-08-01 DIAGNOSIS — M7671 Peroneal tendinitis, right leg: Secondary | ICD-10-CM | POA: Diagnosis not present

## 2019-08-12 ENCOUNTER — Ambulatory Visit (INDEPENDENT_AMBULATORY_CARE_PROVIDER_SITE_OTHER): Payer: BC Managed Care – PPO | Admitting: Gastroenterology

## 2019-08-12 ENCOUNTER — Encounter: Payer: Self-pay | Admitting: Gastroenterology

## 2019-08-12 VITALS — BP 118/58 | HR 80 | Temp 97.8°F | Ht 67.0 in | Wt 201.0 lb

## 2019-08-12 DIAGNOSIS — K648 Other hemorrhoids: Secondary | ICD-10-CM

## 2019-08-12 DIAGNOSIS — K641 Second degree hemorrhoids: Secondary | ICD-10-CM

## 2019-08-12 NOTE — Progress Notes (Signed)
PROCEDURE NOTE: The patient presents with symptomatic grade 2 bleeding hemorrhoids, requesting rubber band ligation of their hemorrhoidal disease.  All risks, benefits and alternative forms of therapy were described and informed consent was obtained.  She is maintained on Coumadin anticoagulation. She understands the risks of bleeding and the risks / benefits of holding anticoagulation or banding on while on anticoagulation. She agrees to proceed with hemorrhoidal banding while maintained on Coumadin anticoagulation.  The anorectum was pre-medicated during digital exam with 0.125% NTG and 5% lidocaine. In the Left Lateral Decubitus position anoscopic examination revealed moderate sized grade 2 hemorrhoids in the RA > RP > LL positions.  The decision was made to band the RA internal hemorrhoid, and the [place] was used to perform band ligation without complication.  Digital anorectal examination was then performed to assure proper positioning of the band and to adjust the banded tissue as required. No adjustment was needed.  No complications were encountered and the patient tolerated the procedure well.  Dietary and behavioral recommendations were given and along with follow-up instructions.   Return for possible additional hemorrhoid banding in 2 - 3 weeks as required.  High fiber diet, Benefiber daily and at least 8 glasses of water daily. The patient was discharged home without pain or other post procedure problems.

## 2019-08-12 NOTE — Patient Instructions (Signed)

## 2019-08-21 DIAGNOSIS — M9903 Segmental and somatic dysfunction of lumbar region: Secondary | ICD-10-CM | POA: Diagnosis not present

## 2019-08-27 ENCOUNTER — Other Ambulatory Visit: Payer: Self-pay | Admitting: Internal Medicine

## 2019-08-27 MED ORDER — WARFARIN SODIUM 5 MG PO TABS: ORAL_TABLET | ORAL | 0 refills | Status: DC

## 2019-09-03 ENCOUNTER — Encounter: Payer: BC Managed Care – PPO | Admitting: Gastroenterology

## 2019-09-05 ENCOUNTER — Other Ambulatory Visit: Payer: Self-pay

## 2019-09-05 ENCOUNTER — Ambulatory Visit: Payer: BC Managed Care – PPO

## 2019-09-05 ENCOUNTER — Ambulatory Visit (INDEPENDENT_AMBULATORY_CARE_PROVIDER_SITE_OTHER): Payer: BC Managed Care – PPO | Admitting: General Practice

## 2019-09-05 DIAGNOSIS — Z7901 Long term (current) use of anticoagulants: Secondary | ICD-10-CM

## 2019-09-05 DIAGNOSIS — D6862 Lupus anticoagulant syndrome: Secondary | ICD-10-CM

## 2019-09-05 LAB — POCT INR: INR: 2.9 (ref 2.0–3.0)

## 2019-09-05 NOTE — Progress Notes (Signed)
Medical screening examination/treatment/procedure(s) were performed by non-physician practitioner and as supervising physician I was immediately available for consultation/collaboration. I agree with above. Nahomi Hegner, MD   

## 2019-09-05 NOTE — Patient Instructions (Addendum)
Pre visit review using our clinic review tool, if applicable. No additional management support is needed unless otherwise documented below in the visit note.  Continue to take 3 tablets daily.  Re-check 4 to 6 weeks.

## 2019-09-08 ENCOUNTER — Encounter: Payer: Self-pay | Admitting: Gastroenterology

## 2019-09-08 ENCOUNTER — Ambulatory Visit (INDEPENDENT_AMBULATORY_CARE_PROVIDER_SITE_OTHER): Payer: BC Managed Care – PPO | Admitting: Gastroenterology

## 2019-09-08 VITALS — BP 112/70 | HR 72 | Temp 96.9°F | Ht 67.0 in | Wt 202.0 lb

## 2019-09-08 DIAGNOSIS — K641 Second degree hemorrhoids: Secondary | ICD-10-CM

## 2019-09-08 DIAGNOSIS — K648 Other hemorrhoids: Secondary | ICD-10-CM

## 2019-09-08 DIAGNOSIS — Z7901 Long term (current) use of anticoagulants: Secondary | ICD-10-CM

## 2019-09-08 NOTE — Patient Instructions (Signed)
HEMORRHOID BANDING PROCEDURE    FOLLOW-UP CARE   1. The procedure you have had should have been relatively painless since the banding of the area involved does not have nerve endings and there is no pain sensation.  The rubber band cuts off the blood supply to the hemorrhoid and the band may fall off as soon as 48 hours after the banding (the band may occasionally be seen in the toilet bowl following a bowel movement). You may notice a temporary feeling of fullness in the rectum which should respond adequately to plain Tylenol or Motrin.  2. Following the banding, avoid strenuous exercise that evening and resume full activity the next day.  A sitz bath (soaking in a warm tub) or bidet is soothing, and can be useful for cleansing the area after bowel movements.     3. To avoid constipation, take two tablespoons of natural wheat bran, natural oat bran, flax, Benefiber or any over the counter fiber supplement and increase your water intake to 7-8 glasses daily.    4. Unless you have been prescribed anorectal medication, do not put anything inside your rectum for two weeks: No suppositories, enemas, fingers, etc.  5. Occasionally, you may have more bleeding than usual after the banding procedure.  This is often from the untreated hemorrhoids rather than the treated one.  Don't be concerned if there is a tablespoon or so of blood.  If there is more blood than this, lie flat with your bottom higher than your head and apply an ice pack to the area. If the bleeding does not stop within a half an hour or if you feel faint, call our office at [phone] or go to the emergency room.  6. Problems are not common; however, if there is a substantial amount of bleeding, severe pain, chills, fever or difficulty passing urine (very rare) or other problems, you should call us at [phone] or report to the nearest emergency room.  7. Do not stay seated continuously for more than 2-3 hours for a day or two  after the procedure.  Tighten your buttock muscles 10-15 times every two hours and take 10-15 deep breaths every 1-2 hours.  Do not spend more than a few minutes on the toilet if you cannot empty your bowel; instead re-visit the toilet at a later time.    Thank you for choosing me and [facility].  Malcolm T. Stark, [name], MD., FACG  

## 2019-09-08 NOTE — Progress Notes (Signed)
PROCEDURE NOTE: The patient presents with symptomatic grade 2 bleeding hemorrhoids, requesting rubber band ligation of their hemorrhoidal disease.  All risks, benefits and alternative forms of therapy were described and informed consent was obtained. She is maintained on Coumadin anticoagulation. She understands the risks of bleeding and the risks / benefits of holding anticoagulation or banding on while on anticoagulation. She agrees to proceed with hemorrhoidal banding while maintained on Coumadin anticoagulation.   RA banded on 08/12/2019 with 1-2 days of rectal pain that resolved completely.  Since banding #1 no prolapse at all and only 2 episodes of scant bleeding.   The anorectum was pre-medicated during digital exam with 0.125% NTG and 5% lidocaine.   The decision was made to band the RP internal hemorrhoid, and the [place] was used to perform band ligation without complication.  Digital anorectal examination was then performed to assure proper positioning of the band and to adjust the banded tissue as required. No adjustment was needed.  No complications were encountered and the patient tolerated the procedure well.  Dietary and behavioral recommendations were given and along with follow-up instructions.   Return for possible additional hemorrhoid banding in 2 - 3 weeks as required.  High fiber diet, Benefiber daily and at least 8 glasses of water daily. The patient was discharged home without pain or other post procedure problems.

## 2019-09-16 DIAGNOSIS — M9903 Segmental and somatic dysfunction of lumbar region: Secondary | ICD-10-CM | POA: Diagnosis not present

## 2019-09-22 DIAGNOSIS — N920 Excessive and frequent menstruation with regular cycle: Secondary | ICD-10-CM | POA: Diagnosis not present

## 2019-09-24 ENCOUNTER — Encounter: Payer: Self-pay | Admitting: Gastroenterology

## 2019-09-24 ENCOUNTER — Ambulatory Visit (INDEPENDENT_AMBULATORY_CARE_PROVIDER_SITE_OTHER): Payer: BC Managed Care – PPO | Admitting: Gastroenterology

## 2019-09-24 VITALS — BP 108/74 | HR 85 | Temp 97.6°F | Ht 67.0 in | Wt 201.0 lb

## 2019-09-24 DIAGNOSIS — K641 Second degree hemorrhoids: Secondary | ICD-10-CM | POA: Diagnosis not present

## 2019-09-24 DIAGNOSIS — K648 Other hemorrhoids: Secondary | ICD-10-CM

## 2019-09-24 NOTE — Progress Notes (Signed)
PROCEDURE NOTE: The patient presents with symptomatic bleeding grade 2 hemorrhoids, requesting rubber band ligation of their hemorrhoidal disease.  All risks, benefits and alternative forms of therapy were described and informed consent was obtained.  She is maintained on Coumadin anticoagulation. Sheunderstands the risks ofbleeding and the risks / benefitsof holding anticoagulation or banding on while on anticoagulation. She consents to proceed with hemorrhoidal banding while maintained on Coumadin anticoagulation.  RP and RA previously banded. No bleeding or prolapse since last banding.   The anorectum was pre-medicated during digital exam with 0.125% NTG and 5% lidocaine. The decision was made to band the LL internal hemorrhoid, and the [place] was used to perform band ligation without complication.  Digital anorectal examination was then performed to assure proper positioning of the band and to adjust the banded tissue as required. No adjustment was needed.  No complications were encountered and the patient tolerated the procedure well.  Dietary and behavioral recommendations were given and along with follow-up instructions.   Return as needed.  High fiber diet, Benefiber daily and at least 8 glasses of water daily. The patient was discharged home without pain or other post procedure problems.

## 2019-09-24 NOTE — Patient Instructions (Signed)

## 2019-10-06 ENCOUNTER — Telehealth: Payer: Self-pay

## 2019-10-06 ENCOUNTER — Telehealth: Payer: Self-pay | Admitting: General Practice

## 2019-10-06 ENCOUNTER — Other Ambulatory Visit: Payer: Self-pay | Admitting: General Practice

## 2019-10-06 DIAGNOSIS — Z7901 Long term (current) use of anticoagulants: Secondary | ICD-10-CM

## 2019-10-06 MED ORDER — ENOXAPARIN SODIUM 150 MG/ML ~~LOC~~ SOLN: 150.0000 mg | SUBCUTANEOUS | 0 refills | Status: DC

## 2019-10-06 NOTE — Telephone Encounter (Signed)
Instructions for coumadin and Lovenox pre and post procedure  1/4 - Last dose of coumadin until after procedure 1/5 - Nothing (No coumadin or Lovenox) 1/6 - Lovenox 100 mg syringe in AM and PM 1/7 - Lovenox 100 mg syring in AM and PM 1/8 - Lovenox 150 mg syrings once in the AM (Take by 7am) 1/9 - Procedure (No Lovenox today) 1/10 - Lovenox in the AM and 4 tablets of coumadin 1/11 - Lovenox in the AM and 4 tablets of coumadin 1/12 - Lovenox in the AM and 4 tablets of coumadin 1/13 - Lovenox in the AM and 4 tablets of coumadin 1/14 - Stop Lovenox and continue to take coumadin, 3 tablets daily 1/15 - Check INR

## 2019-10-06 NOTE — Telephone Encounter (Signed)
-----   Message from Etta Grandchild, MD sent at 10/06/2019 12:04 PM EST ----- Regarding: FW: Lovenox bridge Yes, ok with me  TJ  ----- Message ----- From: Garrison Columbus, RN Sent: 10/06/2019  10:44 AM EST To: Etta Grandchild, MD Subject: Lovenox bridge                                 Hi Dr. Yetta Barre,  Patient is having a "eye brow" procedure on Saturday and will need to stop coumadin today.  She will need to bridge with Lovenox.  Just need your OK to  dose and send to pharmacy.  Please advise.  Thanks, Bailey Mech, RN

## 2019-10-06 NOTE — Telephone Encounter (Signed)
Copied from CRM [id number]. Topic: General - Other >> Oct 06, 2019 10:11 AM Tamela Oddi wrote: Reason for CRM: Patient would like the Coumadin nurse to call her today at #[phone].  She has a few questions before her appt. On Friday.

## 2019-10-07 DIAGNOSIS — M9903 Segmental and somatic dysfunction of lumbar region: Secondary | ICD-10-CM | POA: Diagnosis not present

## 2019-10-10 ENCOUNTER — Ambulatory Visit: Payer: BC Managed Care – PPO

## 2019-10-10 ENCOUNTER — Other Ambulatory Visit: Payer: Self-pay

## 2019-10-10 ENCOUNTER — Ambulatory Visit (INDEPENDENT_AMBULATORY_CARE_PROVIDER_SITE_OTHER): Payer: BC Managed Care – PPO | Admitting: General Practice

## 2019-10-10 DIAGNOSIS — D6862 Lupus anticoagulant syndrome: Secondary | ICD-10-CM

## 2019-10-10 DIAGNOSIS — Z7901 Long term (current) use of anticoagulants: Secondary | ICD-10-CM

## 2019-10-10 LAB — POCT INR: INR: 2.4 (ref 2.0–3.0)

## 2019-10-10 NOTE — Progress Notes (Signed)
Medical screening examination/treatment/procedure(s) were performed by non-physician practitioner and as supervising physician I was immediately available for consultation/collaboration. I agree with above. Damyn Weitzel, MD   

## 2019-10-10 NOTE — Patient Instructions (Addendum)
.  lbpcmh    Continue to take 3 tablets daily.  Re-check 4 to 6 weeks.  Please follow prior patient instructions

## 2019-10-17 ENCOUNTER — Ambulatory Visit: Payer: BC Managed Care – PPO

## 2019-10-27 DIAGNOSIS — M9903 Segmental and somatic dysfunction of lumbar region: Secondary | ICD-10-CM | POA: Diagnosis not present

## 2019-11-13 DIAGNOSIS — M9903 Segmental and somatic dysfunction of lumbar region: Secondary | ICD-10-CM | POA: Diagnosis not present

## 2019-11-14 ENCOUNTER — Ambulatory Visit: Payer: BC Managed Care – PPO

## 2019-11-23 ENCOUNTER — Other Ambulatory Visit: Payer: Self-pay | Admitting: Internal Medicine

## 2019-11-23 DIAGNOSIS — D6862 Lupus anticoagulant syndrome: Secondary | ICD-10-CM

## 2019-11-23 MED ORDER — WARFARIN SODIUM 5 MG PO TABS: ORAL_TABLET | ORAL | 0 refills | Status: DC

## 2019-11-25 ENCOUNTER — Ambulatory Visit: Payer: BC Managed Care – PPO

## 2019-11-25 ENCOUNTER — Other Ambulatory Visit: Payer: Self-pay

## 2019-11-25 ENCOUNTER — Ambulatory Visit (INDEPENDENT_AMBULATORY_CARE_PROVIDER_SITE_OTHER): Payer: BC Managed Care – PPO | Admitting: General Practice

## 2019-11-25 DIAGNOSIS — Z7901 Long term (current) use of anticoagulants: Secondary | ICD-10-CM | POA: Diagnosis not present

## 2019-11-25 DIAGNOSIS — D6862 Lupus anticoagulant syndrome: Secondary | ICD-10-CM

## 2019-11-25 LAB — POCT INR: INR: 2.4 (ref 2.0–3.0)

## 2019-11-25 NOTE — Patient Instructions (Signed)
Pre visit review using our clinic review tool, if applicable. No additional management support is needed unless otherwise documented below in the visit note.  Continue to take 3 tablets daily.  Re-check on 3/25.  3/14 - Last dose of coumadin 3/15 - Nothing 3/16 - Lovenox in the AM 3/17 - Lovenox in the AM 3/18 - Lovenox in the AM 3/19 - Procedure 3/20 - Lovenox in the AM and 4 tablets 3/21 - Lovenox in the AM and 4 tablets 3/22 - Lovenox in the AM and 4 tablets 3/23 - Lovenox in the AM and 4 tablets 3/24 - Stop Lovenox and resume taking 3 tablets daily. 3/25 - Re-check INR

## 2019-12-01 ENCOUNTER — Telehealth: Payer: Self-pay | Admitting: General Practice

## 2019-12-01 ENCOUNTER — Other Ambulatory Visit: Payer: Self-pay | Admitting: General Practice

## 2019-12-01 DIAGNOSIS — Z7901 Long term (current) use of anticoagulants: Secondary | ICD-10-CM

## 2019-12-01 MED ORDER — ENOXAPARIN SODIUM 150 MG/ML ~~LOC~~ SOLN: 150.0000 mg | SUBCUTANEOUS | 0 refills | Status: DC

## 2019-12-01 NOTE — Telephone Encounter (Signed)
-----   Message from Etta Grandchild, MD sent at 11/29/2019 11:36 AM EST ----- Regarding: FW: Lovenox bridge Yes, that is okay  TJ  ----- Message ----- From: Garrison Columbus, RN Sent: 11/25/2019   3:29 PM EST To: Etta Grandchild, MD Subject: Lovenox bridge                                 Patient is having an "eyebrow" micro-blade procedure on 3/19 and will need to stop Coumadin for 5 days.  OK for Lovenox bridge?  Please advise.  Thanks, Bailey Mech, RN

## 2019-12-04 DIAGNOSIS — M9903 Segmental and somatic dysfunction of lumbar region: Secondary | ICD-10-CM | POA: Diagnosis not present

## 2019-12-22 DIAGNOSIS — M9903 Segmental and somatic dysfunction of lumbar region: Secondary | ICD-10-CM | POA: Diagnosis not present

## 2019-12-25 ENCOUNTER — Other Ambulatory Visit: Payer: Self-pay

## 2019-12-25 ENCOUNTER — Ambulatory Visit (INDEPENDENT_AMBULATORY_CARE_PROVIDER_SITE_OTHER): Payer: BC Managed Care – PPO | Admitting: General Practice

## 2019-12-25 DIAGNOSIS — D6862 Lupus anticoagulant syndrome: Secondary | ICD-10-CM

## 2019-12-25 DIAGNOSIS — Z7901 Long term (current) use of anticoagulants: Secondary | ICD-10-CM

## 2019-12-25 LAB — POCT INR: INR: 2 (ref 2.0–3.0)

## 2019-12-25 NOTE — Progress Notes (Signed)
I have reviewed and agree.

## 2019-12-25 NOTE — Patient Instructions (Addendum)
Pre visit review using our clinic review tool, if applicable. No additional management support is needed unless otherwise documented below in the visit note.  Continue to take 3 tablets daily.  Re-check in 6 weeks.

## 2020-01-09 DIAGNOSIS — M2550 Pain in unspecified joint: Secondary | ICD-10-CM | POA: Diagnosis not present

## 2020-01-09 DIAGNOSIS — R21 Rash and other nonspecific skin eruption: Secondary | ICD-10-CM | POA: Diagnosis not present

## 2020-01-09 DIAGNOSIS — M329 Systemic lupus erythematosus, unspecified: Secondary | ICD-10-CM | POA: Diagnosis not present

## 2020-01-09 DIAGNOSIS — M25571 Pain in right ankle and joints of right foot: Secondary | ICD-10-CM | POA: Diagnosis not present

## 2020-01-09 DIAGNOSIS — R760 Raised antibody titer: Secondary | ICD-10-CM | POA: Diagnosis not present

## 2020-01-13 DIAGNOSIS — M9903 Segmental and somatic dysfunction of lumbar region: Secondary | ICD-10-CM | POA: Diagnosis not present

## 2020-02-05 ENCOUNTER — Ambulatory Visit: Payer: BC Managed Care – PPO

## 2020-02-05 ENCOUNTER — Telehealth: Payer: Self-pay | Admitting: General Practice

## 2020-02-05 DIAGNOSIS — D6862 Lupus anticoagulant syndrome: Secondary | ICD-10-CM

## 2020-02-05 DIAGNOSIS — M9903 Segmental and somatic dysfunction of lumbar region: Secondary | ICD-10-CM | POA: Diagnosis not present

## 2020-02-05 MED ORDER — WARFARIN SODIUM 5 MG PO TABS: ORAL_TABLET | ORAL | 1 refills | Status: DC

## 2020-02-12 NOTE — Telephone Encounter (Signed)
Patient states the pharmacy does not have the medication, Can it be resent.

## 2020-02-13 NOTE — Telephone Encounter (Signed)
Pt compliant with coumadin management.   Contacted [facility] and spoke with [facility]. She reports they do have the script and they will contact the pt.

## 2020-02-17 ENCOUNTER — Ambulatory Visit: Payer: BC Managed Care – PPO

## 2020-02-19 ENCOUNTER — Ambulatory Visit: Payer: BC Managed Care – PPO

## 2020-02-24 ENCOUNTER — Other Ambulatory Visit: Payer: Self-pay

## 2020-02-24 ENCOUNTER — Ambulatory Visit: Payer: BC Managed Care – PPO

## 2020-02-24 ENCOUNTER — Ambulatory Visit (INDEPENDENT_AMBULATORY_CARE_PROVIDER_SITE_OTHER): Payer: BC Managed Care – PPO | Admitting: General Practice

## 2020-02-24 DIAGNOSIS — Z7901 Long term (current) use of anticoagulants: Secondary | ICD-10-CM

## 2020-02-24 DIAGNOSIS — D6862 Lupus anticoagulant syndrome: Secondary | ICD-10-CM

## 2020-02-24 LAB — POCT INR: INR: 3.7 — AB (ref 2.0–3.0)

## 2020-02-24 NOTE — Patient Instructions (Addendum)
Pre visit review using our clinic review tool, if applicable. No additional management support is needed unless otherwise documented below in the visit note.  Skip coumadin today and then change dosage and take 3 tablets daily except 2 tablets on Sundays.  Re-check in 3 weeks.

## 2020-02-25 NOTE — Progress Notes (Signed)
I have reviewed and agree.

## 2020-02-26 DIAGNOSIS — M9903 Segmental and somatic dysfunction of lumbar region: Secondary | ICD-10-CM | POA: Diagnosis not present

## 2020-03-15 DIAGNOSIS — M9903 Segmental and somatic dysfunction of lumbar region: Secondary | ICD-10-CM | POA: Diagnosis not present

## 2020-03-15 DIAGNOSIS — M5431 Sciatica, right side: Secondary | ICD-10-CM | POA: Diagnosis not present

## 2020-03-16 ENCOUNTER — Other Ambulatory Visit: Payer: Self-pay

## 2020-03-16 ENCOUNTER — Ambulatory Visit (INDEPENDENT_AMBULATORY_CARE_PROVIDER_SITE_OTHER): Payer: BC Managed Care – PPO | Admitting: General Practice

## 2020-03-16 ENCOUNTER — Ambulatory Visit: Payer: BC Managed Care – PPO

## 2020-03-16 DIAGNOSIS — Z7901 Long term (current) use of anticoagulants: Secondary | ICD-10-CM | POA: Diagnosis not present

## 2020-03-16 DIAGNOSIS — D6862 Lupus anticoagulant syndrome: Secondary | ICD-10-CM | POA: Diagnosis not present

## 2020-03-16 LAB — POCT INR: INR: 2.9 (ref 2.0–3.0)

## 2020-03-16 NOTE — Progress Notes (Signed)
I have reviewed and agree.

## 2020-03-16 NOTE — Patient Instructions (Addendum)
Pre visit review using our clinic review tool, if applicable. No additional management support is needed unless otherwise documented below in the visit note. Continue to take 3 tablets daily except 2 tablets on Sundays.  Re-check in 6 weeks.

## 2020-03-19 ENCOUNTER — Other Ambulatory Visit: Payer: Self-pay | Admitting: Internal Medicine

## 2020-03-19 DIAGNOSIS — Z1231 Encounter for screening mammogram for malignant neoplasm of breast: Secondary | ICD-10-CM

## 2020-03-29 DIAGNOSIS — M9903 Segmental and somatic dysfunction of lumbar region: Secondary | ICD-10-CM | POA: Diagnosis not present

## 2020-03-29 DIAGNOSIS — M5431 Sciatica, right side: Secondary | ICD-10-CM | POA: Diagnosis not present

## 2020-04-08 DIAGNOSIS — M5431 Sciatica, right side: Secondary | ICD-10-CM | POA: Diagnosis not present

## 2020-04-08 DIAGNOSIS — M9903 Segmental and somatic dysfunction of lumbar region: Secondary | ICD-10-CM | POA: Diagnosis not present

## 2020-04-13 LAB — HM PAP SMEAR

## 2020-04-17 IMAGING — MG DIGITAL SCREENING BILATERAL MAMMOGRAM WITH TOMO AND CAD
8 series · 8 of 24 positions shown · non-contrast
Comparison: Previous exam(s).

CLINICAL DATA: Screening.

EXAM:
DIGITAL SCREENING BILATERAL MAMMOGRAM WITH TOMO AND CAD

[R MLO synth-2D]
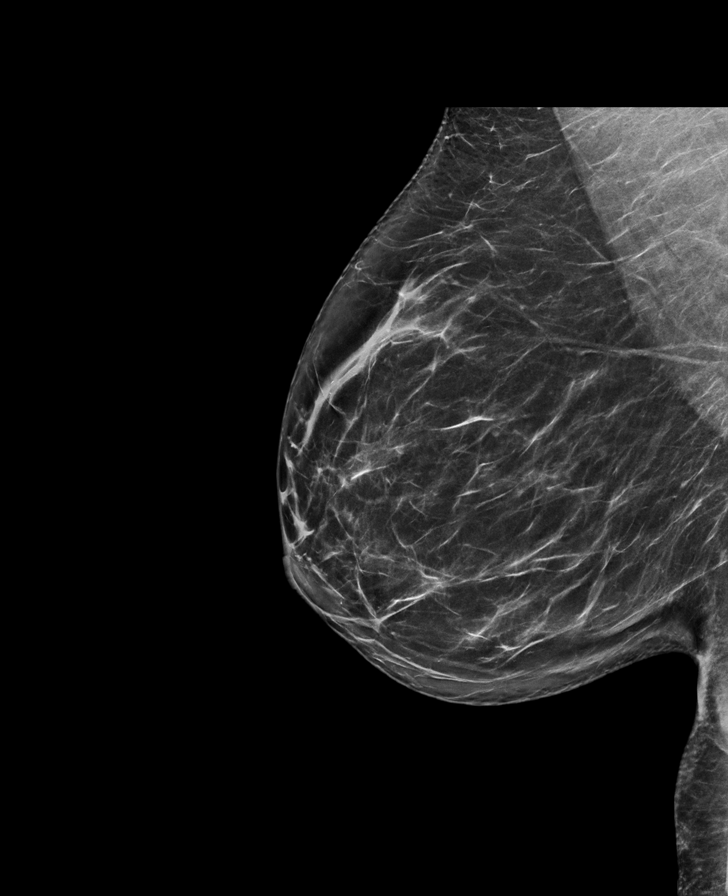

[L MLO synth-2D]
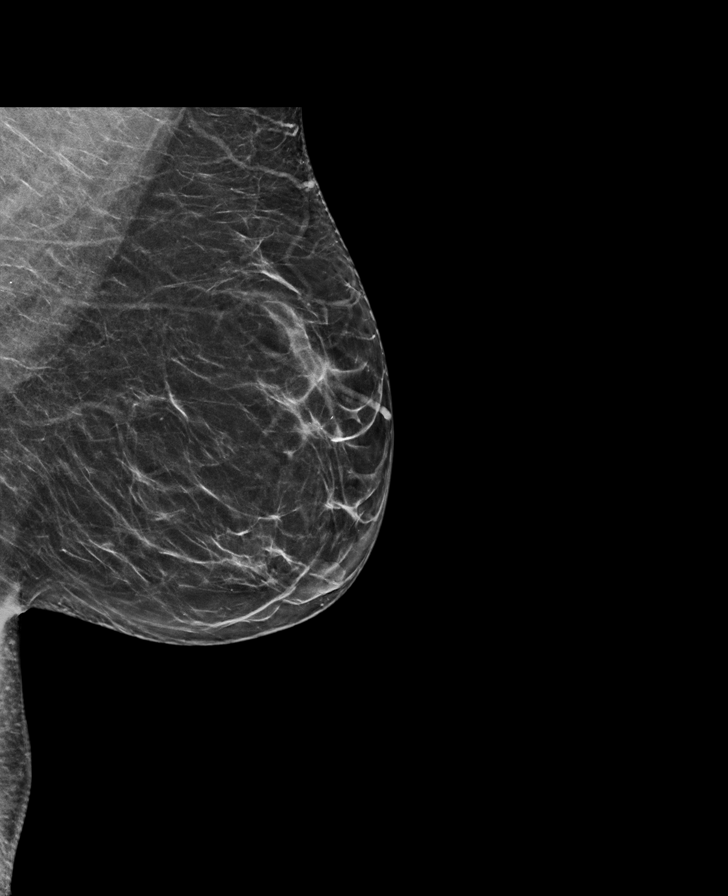

[R CC synth-2D]
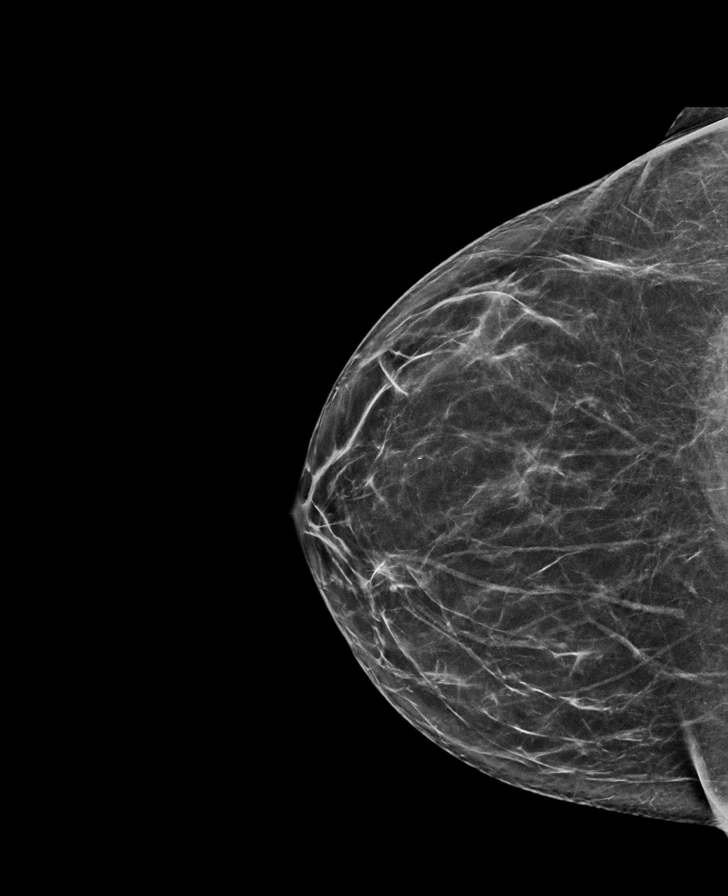

[L CC synth-2D]
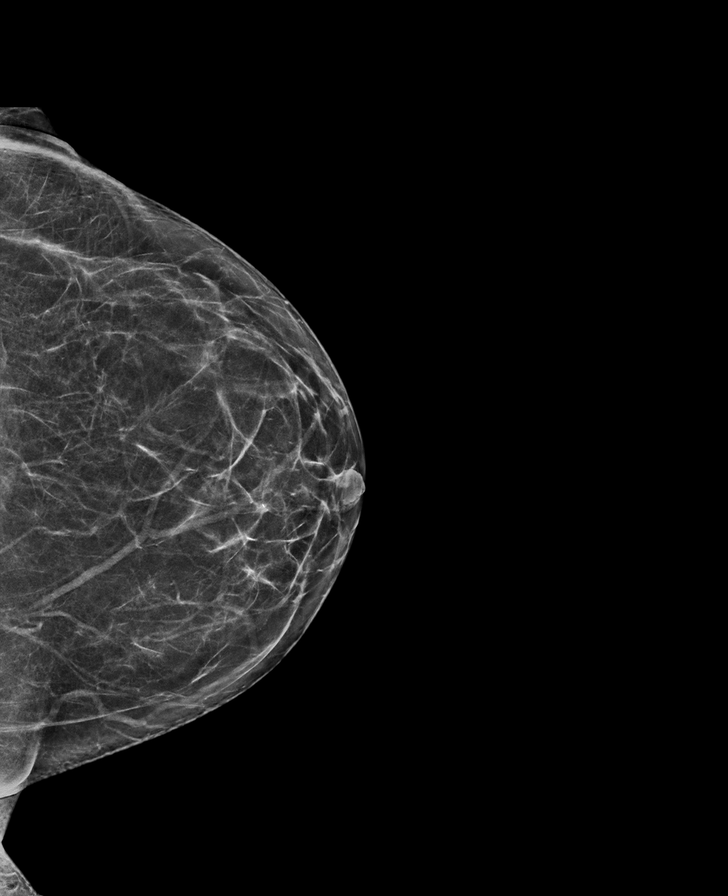

[L MLO tomo · tomo slice 36/71.0]
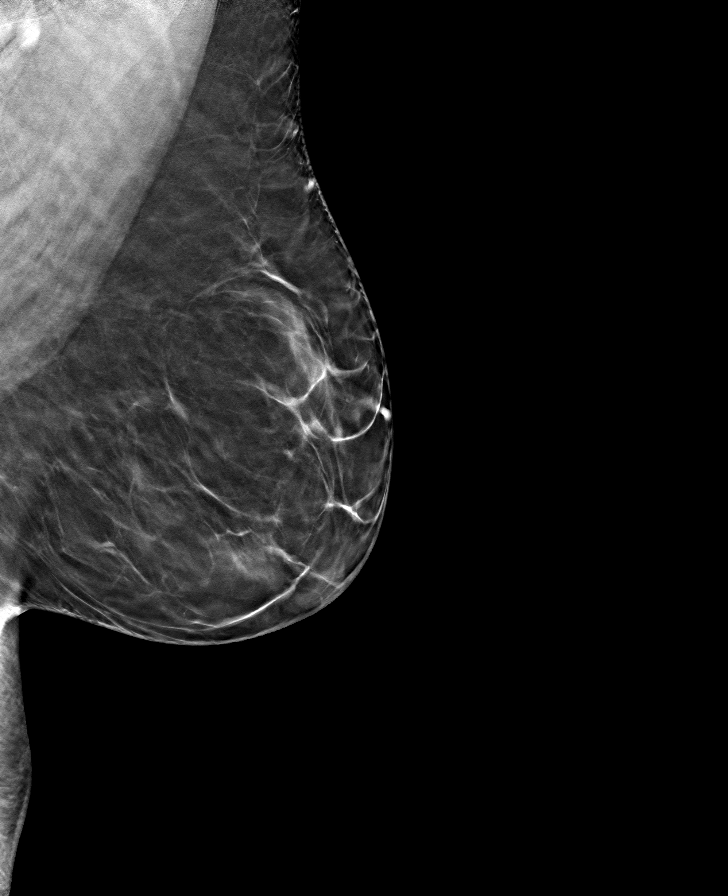

[R CC tomo · tomo slice 35/70.0]
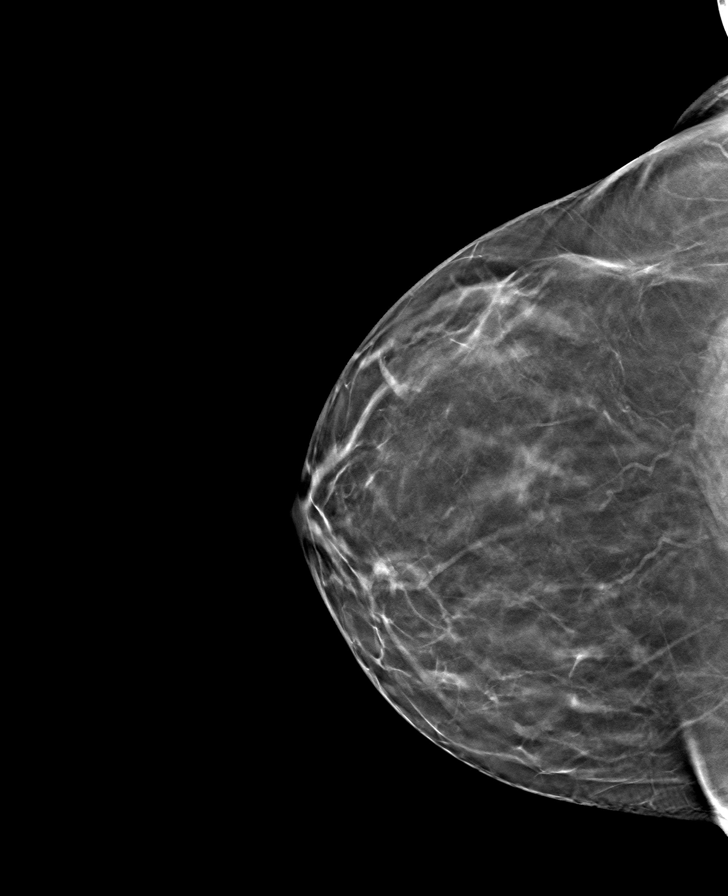

[L CC tomo · tomo slice 34/67.0]
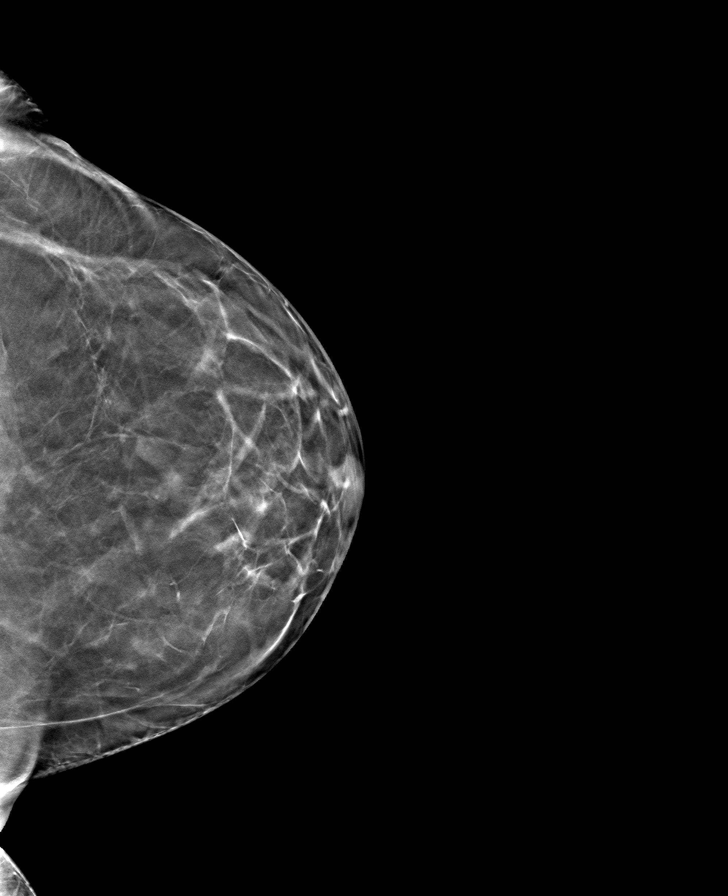

[R MLO tomo · tomo slice 38/75.0]
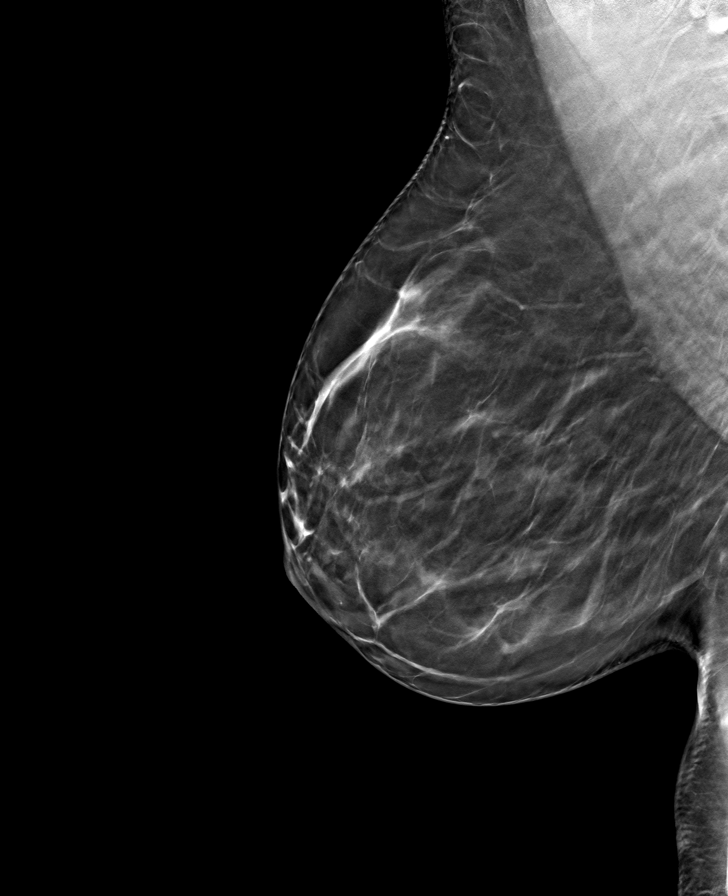

[8 of 24 positions shown; findings below may reference images not displayed]

ACR Breast Density Category b: There are scattered areas of
fibroglandular density.
FINDINGS: There are no findings suspicious for malignancy. Images were
processed with CAD.
IMPRESSION: No mammographic evidence of malignancy. A result letter of this
screening mammogram will be mailed directly to the patient.

RECOMMENDATION:
Screening mammogram in one year. (Code:CN-U-775)

BI-RADS CATEGORY  1: Negative.

## 2020-04-21 ENCOUNTER — Other Ambulatory Visit: Payer: Self-pay

## 2020-04-21 ENCOUNTER — Ambulatory Visit
Admission: RE | Admit: 2020-04-21 | Discharge: 2020-04-21 | Disposition: A | Discharge status: Home / Self Care | Payer: BC Managed Care – PPO | Source: Ambulatory Visit | Attending: Internal Medicine | Admitting: Internal Medicine

## 2020-04-21 DIAGNOSIS — Z1231 Encounter for screening mammogram for malignant neoplasm of breast: Secondary | ICD-10-CM

## 2020-04-21 LAB — HM MAMMOGRAPHY

## 2020-04-27 ENCOUNTER — Ambulatory Visit (INDEPENDENT_AMBULATORY_CARE_PROVIDER_SITE_OTHER): Payer: BC Managed Care – PPO | Admitting: General Practice

## 2020-04-27 ENCOUNTER — Other Ambulatory Visit: Payer: Self-pay

## 2020-04-27 DIAGNOSIS — Z7901 Long term (current) use of anticoagulants: Secondary | ICD-10-CM | POA: Diagnosis not present

## 2020-04-27 DIAGNOSIS — D6862 Lupus anticoagulant syndrome: Secondary | ICD-10-CM

## 2020-04-27 LAB — POCT INR: INR: 3.4 — AB (ref 2.0–3.0)

## 2020-04-27 NOTE — Progress Notes (Signed)
I have reviewed and agree.

## 2020-04-27 NOTE — Patient Instructions (Addendum)
Pre visit review using our clinic review tool, if applicable. No additional management support is needed unless otherwise documented below in the visit note.  Hold coumadin today and then continue to take 3 tablets daily except 2 tablets on Sundays.  Re-check in 4 weeks.

## 2020-04-28 DIAGNOSIS — M5431 Sciatica, right side: Secondary | ICD-10-CM | POA: Diagnosis not present

## 2020-04-28 DIAGNOSIS — M9903 Segmental and somatic dysfunction of lumbar region: Secondary | ICD-10-CM | POA: Diagnosis not present

## 2020-05-05 DIAGNOSIS — M9903 Segmental and somatic dysfunction of lumbar region: Secondary | ICD-10-CM | POA: Diagnosis not present

## 2020-05-05 DIAGNOSIS — M5431 Sciatica, right side: Secondary | ICD-10-CM | POA: Diagnosis not present

## 2020-05-06 ENCOUNTER — Other Ambulatory Visit: Payer: Self-pay | Admitting: Internal Medicine

## 2020-05-06 DIAGNOSIS — D6862 Lupus anticoagulant syndrome: Secondary | ICD-10-CM

## 2020-05-25 ENCOUNTER — Ambulatory Visit: Payer: BC Managed Care – PPO

## 2020-06-01 ENCOUNTER — Other Ambulatory Visit: Payer: Self-pay

## 2020-06-01 ENCOUNTER — Ambulatory Visit (INDEPENDENT_AMBULATORY_CARE_PROVIDER_SITE_OTHER): Payer: BC Managed Care – PPO | Admitting: General Practice

## 2020-06-01 DIAGNOSIS — Z7901 Long term (current) use of anticoagulants: Secondary | ICD-10-CM | POA: Diagnosis not present

## 2020-06-01 DIAGNOSIS — D6862 Lupus anticoagulant syndrome: Secondary | ICD-10-CM | POA: Diagnosis not present

## 2020-06-01 LAB — POCT INR: INR: 3.4 — AB (ref 2.0–3.0)

## 2020-06-01 NOTE — Patient Instructions (Addendum)
Pre visit review using our clinic review tool, if applicable. No additional management support is needed unless otherwise documented below in the visit note.  Take 1 tablet today and then change dosage and take 3 tablets daily except 2 tablets on Sundays and Thursdays.  Re-check in 4 weeks.

## 2020-06-01 NOTE — Progress Notes (Signed)
I have reviewed and agree.

## 2020-06-03 DIAGNOSIS — M5431 Sciatica, right side: Secondary | ICD-10-CM | POA: Diagnosis not present

## 2020-06-03 DIAGNOSIS — M9903 Segmental and somatic dysfunction of lumbar region: Secondary | ICD-10-CM | POA: Diagnosis not present

## 2020-06-17 DIAGNOSIS — Z124 Encounter for screening for malignant neoplasm of cervix: Secondary | ICD-10-CM | POA: Diagnosis not present

## 2020-06-17 DIAGNOSIS — M9903 Segmental and somatic dysfunction of lumbar region: Secondary | ICD-10-CM | POA: Diagnosis not present

## 2020-06-17 DIAGNOSIS — N920 Excessive and frequent menstruation with regular cycle: Secondary | ICD-10-CM | POA: Diagnosis not present

## 2020-06-17 DIAGNOSIS — Z01419 Encounter for gynecological examination (general) (routine) without abnormal findings: Secondary | ICD-10-CM | POA: Diagnosis not present

## 2020-06-17 DIAGNOSIS — Z1151 Encounter for screening for human papillomavirus (HPV): Secondary | ICD-10-CM | POA: Diagnosis not present

## 2020-06-17 DIAGNOSIS — M5431 Sciatica, right side: Secondary | ICD-10-CM | POA: Diagnosis not present

## 2020-06-29 ENCOUNTER — Ambulatory Visit: Payer: BC Managed Care – PPO

## 2020-07-05 DIAGNOSIS — Z20822 Contact with and (suspected) exposure to covid-19: Secondary | ICD-10-CM | POA: Diagnosis not present

## 2020-07-06 ENCOUNTER — Ambulatory Visit: Payer: BC Managed Care – PPO

## 2020-07-06 DIAGNOSIS — M5431 Sciatica, right side: Secondary | ICD-10-CM | POA: Diagnosis not present

## 2020-07-06 DIAGNOSIS — M9903 Segmental and somatic dysfunction of lumbar region: Secondary | ICD-10-CM | POA: Diagnosis not present

## 2020-07-13 ENCOUNTER — Ambulatory Visit (INDEPENDENT_AMBULATORY_CARE_PROVIDER_SITE_OTHER): Payer: BC Managed Care – PPO | Admitting: Internal Medicine

## 2020-07-13 ENCOUNTER — Encounter: Payer: Self-pay | Admitting: Internal Medicine

## 2020-07-13 ENCOUNTER — Ambulatory Visit (INDEPENDENT_AMBULATORY_CARE_PROVIDER_SITE_OTHER): Payer: BC Managed Care – PPO | Admitting: General Practice

## 2020-07-13 ENCOUNTER — Other Ambulatory Visit: Payer: Self-pay

## 2020-07-13 VITALS — BP 126/86 | HR 85 | Temp 98.6°F | Ht 67.0 in | Wt 220.0 lb

## 2020-07-13 DIAGNOSIS — E669 Obesity, unspecified: Secondary | ICD-10-CM | POA: Diagnosis not present

## 2020-07-13 DIAGNOSIS — Z7901 Long term (current) use of anticoagulants: Secondary | ICD-10-CM | POA: Diagnosis not present

## 2020-07-13 DIAGNOSIS — R760 Raised antibody titer: Secondary | ICD-10-CM | POA: Diagnosis not present

## 2020-07-13 DIAGNOSIS — Z0000 Encounter for general adult medical examination without abnormal findings: Secondary | ICD-10-CM

## 2020-07-13 DIAGNOSIS — D6862 Lupus anticoagulant syndrome: Secondary | ICD-10-CM

## 2020-07-13 DIAGNOSIS — Z1159 Encounter for screening for other viral diseases: Secondary | ICD-10-CM | POA: Diagnosis not present

## 2020-07-13 LAB — HEPATIC FUNCTION PANEL
ALT: 13 U/L (ref 0–35)
AST: 16 U/L (ref 0–37)
Albumin: 4.3 g/dL (ref 3.5–5.2)
Alkaline Phosphatase: 30 U/L — ABNORMAL LOW (ref 39–117)
Bilirubin, Direct: 0.1 mg/dL (ref 0.0–0.3)
Total Bilirubin: 0.5 mg/dL (ref 0.2–1.2)
Total Protein: 7.2 g/dL (ref 6.0–8.3)

## 2020-07-13 LAB — CBC WITH DIFFERENTIAL/PLATELET
Basophils Absolute: 0 10*3/uL (ref 0.0–0.1)
Basophils Relative: 0.7 % (ref 0.0–3.0)
Eosinophils Absolute: 0.1 10*3/uL (ref 0.0–0.7)
Eosinophils Relative: 2.2 % (ref 0.0–5.0)
HCT: 40.5 % (ref 36.0–46.0)
Hemoglobin: 13.6 g/dL (ref 12.0–15.0)
Lymphocytes Relative: 31 % (ref 12.0–46.0)
Lymphs Abs: 1.4 10*3/uL (ref 0.7–4.0)
MCHC: 33.6 g/dL (ref 30.0–36.0)
MCV: 91.1 fl (ref 78.0–100.0)
Monocytes Absolute: 0.4 10*3/uL (ref 0.1–1.0)
Monocytes Relative: 8.7 % (ref 3.0–12.0)
Neutro Abs: 2.5 10*3/uL (ref 1.4–7.7)
Neutrophils Relative %: 57.4 % (ref 43.0–77.0)
Platelets: 216 10*3/uL (ref 150.0–400.0)
RBC: 4.45 Mil/uL (ref 3.87–5.11)
RDW: 14 % (ref 11.5–15.5)
WBC: 4.4 10*3/uL (ref 4.0–10.5)

## 2020-07-13 LAB — LIPID PANEL
Cholesterol: 128 mg/dL (ref 0–200)
HDL: 80 mg/dL (ref >39.00)
LDL Cholesterol: 39 mg/dL (ref 0–99)
NonHDL: 47.73
Total CHOL/HDL Ratio: 2
Triglycerides: 46 mg/dL (ref 0.0–149.0)
VLDL: 9.2 mg/dL (ref 0.0–40.0)

## 2020-07-13 LAB — POCT INR: INR: 2.8 (ref 2.0–3.0)

## 2020-07-13 LAB — TSH: TSH: 1.94 u[IU]/mL (ref 0.35–4.50)

## 2020-07-13 NOTE — Patient Instructions (Signed)
Pre visit review using our clinic review tool, if applicable. No additional management support is needed unless otherwise documented below in the visit note.  Continue to take 3 tablets daily except 2 tablets on Sundays and Thursdays.  Re-check in 4 weeks.

## 2020-07-13 NOTE — Progress Notes (Signed)
Subjective:  Patient ID: Zoe Miles, female    DOB: 1975-11-20  Age: 44 y.o. MRN: [id number]  CC: Annual Exam  This visit occurred during the SARS-CoV-2 public health emergency.  Safety protocols were in place, including screening questions prior to the visit, additional usage of staff PPE, and extensive cleaning of exam room while observing appropriate contact time as indicated for disinfecting solutions.    HPI Zoe Miles presents for a CPX.  She complains of weight gain.  She would like to use a pharmaceutical agent to help her lose weight.  Outpatient Medications Prior to Visit  Medication Sig Dispense Refill  . cycloSPORINE (RESTASIS) 0.05 % ophthalmic emulsion Place 1 drop into both eyes 2 (two) times daily.    . hydroxychloroquine (PLAQUENIL) 200 MG tablet Take 200 mg by mouth 2 (two) times daily.     . norethindrone (MICRONOR,CAMILA,ERRIN) 0.35 MG tablet Take 1 tablet by mouth daily.    . Omega-3 Fatty Acids (FISH OIL PO) Take 1 capsule by mouth daily.     Marland Kitchen warfarin (COUMADIN) 5 MG tablet TAKE AS DIRECTED 240 tablet 0  . enoxaparin (LOVENOX) 150 MG/ML injection Inject 1 mL (150 mg total) into the skin daily for 7 doses. 7 mL 0   No facility-administered medications prior to visit.    ROS Review of Systems  Constitutional: Positive for unexpected weight change. Negative for appetite change, chills, diaphoresis and fatigue.  HENT: Negative.   Eyes: Negative for visual disturbance.  Respiratory: Negative for cough, chest tightness, shortness of breath and wheezing.   Cardiovascular: Negative for chest pain, palpitations and leg swelling.  Gastrointestinal: Negative for abdominal pain, constipation, diarrhea and vomiting.  Endocrine: Negative.   Genitourinary: Negative.  Negative for difficulty urinating.  Musculoskeletal: Negative.  Negative for arthralgias and myalgias.  Skin: Negative for color change, pallor and rash.  Neurological: Negative.   Negative for dizziness.  Hematological: Negative for adenopathy. Does not bruise/bleed easily.  Psychiatric/Behavioral: Negative.     Objective:  BP 126/86   Pulse 85   Temp 98.6 F (37 C) (Oral)   Ht 5\' 7"  (1.702 m)   Wt 220 lb (99.8 kg)   SpO2 98%   BMI 34.46 kg/m   BP Readings from Last 3 Encounters:  07/13/20 126/86  09/24/19 108/74  09/08/19 112/70    Wt Readings from Last 3 Encounters:  07/13/20 220 lb (99.8 kg)  09/24/19 201 lb (91.2 kg)  09/08/19 202 lb (91.6 kg)    Physical Exam Vitals reviewed.  HENT:     Nose: Nose normal.     Mouth/Throat:     Mouth: Mucous membranes are moist.  Eyes:     General: No scleral icterus.    Conjunctiva/sclera: Conjunctivae normal.  Cardiovascular:     Rate and Rhythm: Normal rate and regular rhythm.     Heart sounds: No murmur heard.   Pulmonary:     Effort: Pulmonary effort is normal.     Breath sounds: No stridor. No wheezing, rhonchi or rales.  Abdominal:     General: Abdomen is flat. Bowel sounds are normal. There is no distension.     Palpations: Abdomen is soft. There is no hepatomegaly, splenomegaly or mass.     Tenderness: There is no abdominal tenderness.  Musculoskeletal:        General: Normal range of motion.     Cervical back: Neck supple.     Right lower leg: No edema.     Left  lower leg: No edema.  Lymphadenopathy:     Cervical: No cervical adenopathy.  Skin:    General: Skin is warm and dry.     Coloration: Skin is not pale.  Neurological:     General: No focal deficit present.     Mental Status: She is alert.  Psychiatric:        Mood and Affect: Mood normal.        Behavior: Behavior normal.     Lab Results  Component Value Date   WBC 4.4 07/13/2020   HGB 13.6 07/13/2020   HCT 40.5 07/13/2020   PLT 216.0 07/13/2020   GLUCOSE 80 07/13/2020   CHOL 128 07/13/2020   TRIG 46.0 07/13/2020   HDL 80.00 07/13/2020   LDLCALC 39 07/13/2020   ALT 13 07/13/2020   AST 16 07/13/2020   NA 142  07/13/2020   K 4.0 07/13/2020   CL 106 07/13/2020   CREATININE 0.70 07/13/2020   BUN 13 07/13/2020   CO2 25 07/13/2020   TSH 1.94 07/13/2020   INR 2.8 07/13/2020    MM 3D SCREEN BREAST BILATERAL  Result Date: 04/22/2020 CLINICAL DATA:  Screening. EXAM: DIGITAL SCREENING BILATERAL MAMMOGRAM WITH TOMO AND CAD COMPARISON:  Previous exam(s). ACR Breast Density Category b: There are scattered areas of fibroglandular density. FINDINGS: There are no findings suspicious for malignancy. Images were processed with CAD. IMPRESSION: No mammographic evidence of malignancy. A result letter of this screening mammogram will be mailed directly to the patient. RECOMMENDATION: Screening mammogram in one year. (Code:[id number]-B-01Y) BI-RADS CATEGORY  1: Negative. Electronically Signed   By: Sherian Rein M.D.   On: 04/22/2020 12:52    Assessment & Plan:   Zoe Miles was seen today for annual exam.  Diagnoses and all orders for this visit:  Lupus anticoagulant with hypercoagulable state (HCC)- See below. -     Antiphospholipid Syndrome Diagnostic Panel-([facility]); Future -     CBC with Differential/Platelet; Future -     BASIC METABOLIC PANEL WITH GFR; Future -     Cancel: Protime-INR; Future -     Hepatic function panel; Future -     Hepatic function panel -     BASIC METABOLIC PANEL WITH GFR -     CBC with Differential/Platelet -     Antiphospholipid Syndrome Diagnostic Panel-([facility])  Anti-cardiolipin antibody positive- Her anti-cardiolipin antibody remains positive.  I recommended that she continue to be anticoagulated with warfarin. -     Antiphospholipid Syndrome Diagnostic Panel-([facility]); Future -     CBC with Differential/Platelet; Future -     BASIC METABOLIC PANEL WITH GFR; Future -     Cancel: Protime-INR; Future -     Hepatic function panel; Future -     Hepatic function panel -     BASIC METABOLIC PANEL WITH GFR -     CBC with Differential/Platelet -     Antiphospholipid Syndrome Diagnostic  Panel-([facility])  Routine general medical examination at a health care facility- Exam completed, labs reviewed, vaccines reviewed, cancer screenings are up-to-date, patient education was given. -     Lipid panel; Future -     Hepatitis C antibody; Future -     HIV Antibody (routine testing w rflx); Future -     HIV Antibody (routine testing w rflx) -     Hepatitis C antibody -     Lipid panel  Need for hepatitis C screening test -     Hepatitis C antibody; Future -  Hepatitis C antibody  Obesity (BMI 30.0-34.9)- Her labs are negative for secondary causes.  In addition to lifestyle modifications I recommended that she start using a GLP-1 agonist to help reduce her caloric intake. -     TSH; Future -     TSH -     Semaglutide-Weight Management (WEGOVY) 0.25 MG/0.5ML SOAJ; Inject 0.25 mg into the skin once a week. -     Insulin Pen Needle 32G X 6 MM MISC; 1 Act by Does not apply route once a week.   I am having Zoe Miles start on Wegovy and Insulin Pen Needle. I am also having her maintain her hydroxychloroquine, Omega-3 Fatty Acids (FISH OIL PO), norethindrone, cycloSPORINE, enoxaparin, and warfarin.  Meds ordered this encounter  Medications  . Semaglutide-Weight Management (WEGOVY) 0.25 MG/0.5ML SOAJ    Sig: Inject 0.25 mg into the skin once a week.    Dispense:  2 mL    Refill:  0  . Insulin Pen Needle 32G X 6 MM MISC    Sig: 1 Act by Does not apply route once a week.    Dispense:  50 each    Refill:  0     Follow-up: No follow-ups on file.  Sanda Linger, MD

## 2020-07-13 NOTE — Progress Notes (Signed)
I have reviewed and agree.

## 2020-07-14 MED ORDER — INSULIN PEN NEEDLE 32G X 6 MM MISC: 1.0000 | 0 refills | Status: DC

## 2020-07-14 MED ORDER — WEGOVY 0.25 MG/0.5ML ~~LOC~~ SOAJ
0.2500 mg | SUBCUTANEOUS | 0 refills | Status: AC
Start: 2020-07-14 — End: 2020-08-14

## 2020-07-16 DIAGNOSIS — M25571 Pain in right ankle and joints of right foot: Secondary | ICD-10-CM | POA: Diagnosis not present

## 2020-07-16 DIAGNOSIS — R760 Raised antibody titer: Secondary | ICD-10-CM | POA: Diagnosis not present

## 2020-07-16 DIAGNOSIS — M1990 Unspecified osteoarthritis, unspecified site: Secondary | ICD-10-CM | POA: Diagnosis not present

## 2020-07-16 DIAGNOSIS — M2550 Pain in unspecified joint: Secondary | ICD-10-CM | POA: Diagnosis not present

## 2020-07-16 LAB — ANTIPHOSPHOLIPID SYNDROME DIAGNOSTIC PANEL
Anticardiolipin IgA: 14.9 APL-U/mL (ref <20.0)
Anticardiolipin IgG: 36.7 GPL-U/mL — ABNORMAL HIGH (ref <20.0)
Anticardiolipin IgM: 30.4 MPL-U/mL — ABNORMAL HIGH (ref <20.0)
Beta-2 Glyco 1 IgA: 16.4 U/mL (ref <20.0)
Beta-2 Glyco 1 IgM: 18.7 U/mL (ref <20.0)
Beta-2 Glyco I IgG: 41.3 U/mL — ABNORMAL HIGH (ref <20.0)
PTT-LA Screen: 52 s — ABNORMAL HIGH (ref <=40)
dRVVT: 47 s — ABNORMAL HIGH (ref <=45)

## 2020-07-16 LAB — BASIC METABOLIC PANEL WITH GFR
BUN: 13 mg/dL (ref 7–25)
CO2: 25 mmol/L (ref 20–32)
Calcium: 9 mg/dL (ref 8.6–10.2)
Chloride: 106 mmol/L (ref 98–110)
Creat: 0.7 mg/dL (ref 0.50–1.10)
GFR, Est African American: 124 mL/min/{1.73_m2} (ref >=60)
GFR, Est Non African American: 107 mL/min/{1.73_m2} (ref >=60)
Glucose, Bld: 80 mg/dL (ref 65–99)
Potassium: 4 mmol/L (ref 3.5–5.3)
Sodium: 142 mmol/L (ref 135–146)

## 2020-07-16 LAB — HEPATITIS C ANTIBODY
Hepatitis C Ab: NONREACTIVE
SIGNAL TO CUT-OFF: 0.01 (ref <1.00)

## 2020-07-16 LAB — RFLX DRVVT CONFRIM: DRVVT CONFIRM: NEGATIVE

## 2020-07-16 LAB — RFLX HEXAGONAL PHASE CONFIRM: Hexagonal Phase Conf: NEGATIVE

## 2020-07-16 LAB — HIV ANTIBODY (ROUTINE TESTING W REFLEX): HIV 1&2 Ab, 4th Generation: NONREACTIVE

## 2020-08-05 ENCOUNTER — Other Ambulatory Visit: Payer: Self-pay | Admitting: General Practice

## 2020-08-05 DIAGNOSIS — D6862 Lupus anticoagulant syndrome: Secondary | ICD-10-CM

## 2020-08-05 MED ORDER — WARFARIN SODIUM 5 MG PO TABS: ORAL_TABLET | ORAL | 1 refills | Status: DC

## 2020-08-09 DIAGNOSIS — M5431 Sciatica, right side: Secondary | ICD-10-CM | POA: Diagnosis not present

## 2020-08-09 DIAGNOSIS — M9903 Segmental and somatic dysfunction of lumbar region: Secondary | ICD-10-CM | POA: Diagnosis not present

## 2020-08-19 DIAGNOSIS — Z20822 Contact with and (suspected) exposure to covid-19: Secondary | ICD-10-CM | POA: Diagnosis not present

## 2020-08-24 ENCOUNTER — Ambulatory Visit (INDEPENDENT_AMBULATORY_CARE_PROVIDER_SITE_OTHER): Payer: BC Managed Care – PPO | Admitting: General Practice

## 2020-08-24 ENCOUNTER — Other Ambulatory Visit: Payer: Self-pay

## 2020-08-24 DIAGNOSIS — D6862 Lupus anticoagulant syndrome: Secondary | ICD-10-CM | POA: Diagnosis not present

## 2020-08-24 DIAGNOSIS — Z7901 Long term (current) use of anticoagulants: Secondary | ICD-10-CM

## 2020-08-24 DIAGNOSIS — M5431 Sciatica, right side: Secondary | ICD-10-CM | POA: Diagnosis not present

## 2020-08-24 DIAGNOSIS — M9903 Segmental and somatic dysfunction of lumbar region: Secondary | ICD-10-CM | POA: Diagnosis not present

## 2020-08-24 LAB — POCT INR: INR: 2.7 (ref 2.0–3.0)

## 2020-08-24 NOTE — Patient Instructions (Addendum)
Pre visit review using our clinic review tool, if applicable. No additional management support is needed unless otherwise documented below in the visit note.  Continue to take 3 tablets daily except 2 tablets on Sundays and Thursdays.  Re-check in 4 weeks.  

## 2020-08-24 NOTE — Progress Notes (Signed)
I have reviewed and agree.

## 2020-09-20 DIAGNOSIS — M5431 Sciatica, right side: Secondary | ICD-10-CM | POA: Diagnosis not present

## 2020-09-20 DIAGNOSIS — M9903 Segmental and somatic dysfunction of lumbar region: Secondary | ICD-10-CM | POA: Diagnosis not present

## 2020-10-12 ENCOUNTER — Other Ambulatory Visit: Payer: Self-pay

## 2020-10-12 ENCOUNTER — Ambulatory Visit (INDEPENDENT_AMBULATORY_CARE_PROVIDER_SITE_OTHER): Payer: BC Managed Care – PPO | Admitting: General Practice

## 2020-10-12 DIAGNOSIS — Z7901 Long term (current) use of anticoagulants: Secondary | ICD-10-CM

## 2020-10-12 DIAGNOSIS — D6862 Lupus anticoagulant syndrome: Secondary | ICD-10-CM | POA: Diagnosis not present

## 2020-10-12 LAB — POCT INR: INR: 2.2 (ref 2.0–3.0)

## 2020-10-12 NOTE — Patient Instructions (Addendum)
Pre visit review using our clinic review tool, if applicable. No additional management support is needed unless otherwise documented below in the visit note.  Continue to take 3 tablets daily except 2 tablets on Sundays and Thursdays.  Re-check in 6 weeks. 

## 2020-10-12 NOTE — Progress Notes (Signed)
I have reviewed and agree.

## 2020-10-20 DIAGNOSIS — I83891 Varicose veins of right lower extremities with other complications: Secondary | ICD-10-CM | POA: Diagnosis not present

## 2020-10-20 DIAGNOSIS — I83811 Varicose veins of right lower extremities with pain: Secondary | ICD-10-CM | POA: Diagnosis not present

## 2020-10-20 DIAGNOSIS — I83892 Varicose veins of left lower extremities with other complications: Secondary | ICD-10-CM | POA: Diagnosis not present

## 2020-11-23 ENCOUNTER — Ambulatory Visit (INDEPENDENT_AMBULATORY_CARE_PROVIDER_SITE_OTHER): Payer: BC Managed Care – PPO | Admitting: General Practice

## 2020-11-23 ENCOUNTER — Other Ambulatory Visit: Payer: Self-pay

## 2020-11-23 DIAGNOSIS — D6862 Lupus anticoagulant syndrome: Secondary | ICD-10-CM

## 2020-11-23 DIAGNOSIS — Z7901 Long term (current) use of anticoagulants: Secondary | ICD-10-CM | POA: Diagnosis not present

## 2020-11-23 LAB — POCT INR: INR: 3.9 — AB (ref 2.0–3.0)

## 2020-11-23 NOTE — Patient Instructions (Signed)
Pre visit review using our clinic review tool, if applicable. No additional management support is needed unless otherwise documented below in the visit note.  Continue to take 3 tablets daily except 2 tablets on Sundays and Thursdays.  Re-check in 6 weeks. 

## 2020-11-23 NOTE — Progress Notes (Signed)
I have reviewed and agree.

## 2021-01-04 ENCOUNTER — Other Ambulatory Visit: Payer: Self-pay

## 2021-01-04 ENCOUNTER — Ambulatory Visit (INDEPENDENT_AMBULATORY_CARE_PROVIDER_SITE_OTHER): Payer: BC Managed Care – PPO | Admitting: General Practice

## 2021-01-04 DIAGNOSIS — Z7901 Long term (current) use of anticoagulants: Secondary | ICD-10-CM | POA: Diagnosis not present

## 2021-01-04 DIAGNOSIS — D6862 Lupus anticoagulant syndrome: Secondary | ICD-10-CM | POA: Diagnosis not present

## 2021-01-04 LAB — POCT INR: INR: 2.8 (ref 2.0–3.0)

## 2021-01-04 NOTE — Progress Notes (Signed)
I have reviewed and agree.

## 2021-01-04 NOTE — Patient Instructions (Addendum)
Pre visit review using our clinic review tool, if applicable. No additional management support is needed unless otherwise documented below in the visit note.  Continue to take 3 tablets daily except 2 tablets on Sundays and Thursdays.  Re-check in 6 weeks. 

## 2021-01-28 DIAGNOSIS — Z20822 Contact with and (suspected) exposure to covid-19: Secondary | ICD-10-CM | POA: Diagnosis not present

## 2021-02-01 ENCOUNTER — Other Ambulatory Visit: Payer: Self-pay | Admitting: General Practice

## 2021-02-01 DIAGNOSIS — D6862 Lupus anticoagulant syndrome: Secondary | ICD-10-CM

## 2021-02-01 MED ORDER — WARFARIN SODIUM 5 MG PO TABS: ORAL_TABLET | ORAL | 1 refills | Status: DC

## 2021-02-15 ENCOUNTER — Other Ambulatory Visit: Payer: Self-pay

## 2021-02-15 ENCOUNTER — Ambulatory Visit (INDEPENDENT_AMBULATORY_CARE_PROVIDER_SITE_OTHER): Payer: BC Managed Care – PPO | Admitting: General Practice

## 2021-02-15 DIAGNOSIS — Z7901 Long term (current) use of anticoagulants: Secondary | ICD-10-CM | POA: Diagnosis not present

## 2021-02-15 DIAGNOSIS — D6862 Lupus anticoagulant syndrome: Secondary | ICD-10-CM

## 2021-02-15 LAB — POCT INR: INR: 2.4 (ref 2.0–3.0)

## 2021-02-15 NOTE — Patient Instructions (Addendum)
Pre visit review using our clinic review tool, if applicable. No additional management support is needed unless otherwise documented below in the visit note.  Continue to take 3 tablets daily except 2 tablets on Sundays and Thursdays.  Re-check in 6 weeks. 

## 2021-02-15 NOTE — Progress Notes (Signed)
I have reviewed and agree.

## 2021-03-04 DIAGNOSIS — M2550 Pain in unspecified joint: Secondary | ICD-10-CM | POA: Diagnosis not present

## 2021-03-04 DIAGNOSIS — M25571 Pain in right ankle and joints of right foot: Secondary | ICD-10-CM | POA: Diagnosis not present

## 2021-03-04 DIAGNOSIS — R760 Raised antibody titer: Secondary | ICD-10-CM | POA: Diagnosis not present

## 2021-03-04 DIAGNOSIS — M1990 Unspecified osteoarthritis, unspecified site: Secondary | ICD-10-CM | POA: Diagnosis not present

## 2021-03-23 ENCOUNTER — Other Ambulatory Visit: Payer: Self-pay | Admitting: Internal Medicine

## 2021-03-23 DIAGNOSIS — Z1231 Encounter for screening mammogram for malignant neoplasm of breast: Secondary | ICD-10-CM

## 2021-03-29 ENCOUNTER — Ambulatory Visit (INDEPENDENT_AMBULATORY_CARE_PROVIDER_SITE_OTHER): Payer: BC Managed Care – PPO | Admitting: General Practice

## 2021-03-29 ENCOUNTER — Other Ambulatory Visit: Payer: Self-pay

## 2021-03-29 DIAGNOSIS — D6862 Lupus anticoagulant syndrome: Secondary | ICD-10-CM

## 2021-03-29 DIAGNOSIS — Z7901 Long term (current) use of anticoagulants: Secondary | ICD-10-CM | POA: Diagnosis not present

## 2021-03-29 LAB — POCT INR: INR: 2.9 (ref 2.0–3.0)

## 2021-03-29 NOTE — Progress Notes (Signed)
I have reviewed and agree.

## 2021-03-29 NOTE — Patient Instructions (Addendum)
Pre visit review using our clinic review tool, if applicable. No additional management support is needed unless otherwise documented below in the visit note.  Continue to take 3 tablets daily except 2 tablets on Sundays and Thursdays.  Re-check in 6 weeks.

## 2021-04-08 DIAGNOSIS — I8311 Varicose veins of right lower extremity with inflammation: Secondary | ICD-10-CM | POA: Diagnosis not present

## 2021-04-08 DIAGNOSIS — I83813 Varicose veins of bilateral lower extremities with pain: Secondary | ICD-10-CM | POA: Diagnosis not present

## 2021-05-10 ENCOUNTER — Ambulatory Visit: Payer: BC Managed Care – PPO | Admitting: General Practice

## 2021-05-10 ENCOUNTER — Other Ambulatory Visit: Payer: Self-pay

## 2021-05-10 ENCOUNTER — Ambulatory Visit: Payer: BC Managed Care – PPO

## 2021-05-10 DIAGNOSIS — D6862 Lupus anticoagulant syndrome: Secondary | ICD-10-CM

## 2021-05-10 DIAGNOSIS — Z7901 Long term (current) use of anticoagulants: Secondary | ICD-10-CM

## 2021-05-10 LAB — POCT INR: INR: 3.3 — AB (ref 2.0–3.0)

## 2021-05-10 NOTE — Patient Instructions (Signed)
Pre visit review using our clinic review tool, if applicable. No additional management support is needed unless otherwise documented below in the visit note.  Take 1 tablet today and then continue to take 3 tablets daily except 2 tablets on Sundays and Thursdays.  Re-check in 6 weeks per patient request.

## 2021-05-17 ENCOUNTER — Other Ambulatory Visit: Payer: Self-pay

## 2021-05-17 ENCOUNTER — Ambulatory Visit
Admission: RE | Admit: 2021-05-17 | Discharge: 2021-05-17 | Disposition: A | Discharge status: Home / Self Care | Payer: BC Managed Care – PPO | Source: Ambulatory Visit | Attending: Internal Medicine | Admitting: Internal Medicine

## 2021-05-17 DIAGNOSIS — Z1231 Encounter for screening mammogram for malignant neoplasm of breast: Secondary | ICD-10-CM | POA: Diagnosis not present

## 2021-06-21 ENCOUNTER — Ambulatory Visit (INDEPENDENT_AMBULATORY_CARE_PROVIDER_SITE_OTHER): Payer: BC Managed Care – PPO

## 2021-06-21 ENCOUNTER — Other Ambulatory Visit: Payer: Self-pay

## 2021-06-21 DIAGNOSIS — D6862 Lupus anticoagulant syndrome: Secondary | ICD-10-CM

## 2021-06-21 DIAGNOSIS — Z7901 Long term (current) use of anticoagulants: Secondary | ICD-10-CM | POA: Diagnosis not present

## 2021-06-21 LAB — POCT INR: INR: 3 (ref 2.0–3.0)

## 2021-06-21 MED ORDER — WARFARIN SODIUM 5 MG PO TABS: ORAL_TABLET | ORAL | 1 refills | Status: DC

## 2021-06-21 NOTE — Progress Notes (Signed)
Pt also requested refills of coumadin. Sent in script  I have reviewed and agree

## 2021-06-21 NOTE — Patient Instructions (Addendum)
Pre visit review using our clinic review tool, if applicable. No additional management support is needed unless otherwise documented below in the visit note.  Take 1 tablet today and then continue to take 3 tablets daily except 2 tablets on Sundays and Thursdays.  Re-check in 6 weeks.

## 2021-07-06 DIAGNOSIS — I8311 Varicose veins of right lower extremity with inflammation: Secondary | ICD-10-CM | POA: Diagnosis not present

## 2021-07-06 DIAGNOSIS — I83813 Varicose veins of bilateral lower extremities with pain: Secondary | ICD-10-CM | POA: Diagnosis not present

## 2021-07-15 DIAGNOSIS — N393 Stress incontinence (female) (male): Secondary | ICD-10-CM | POA: Diagnosis not present

## 2021-07-15 DIAGNOSIS — N920 Excessive and frequent menstruation with regular cycle: Secondary | ICD-10-CM | POA: Diagnosis not present

## 2021-07-15 DIAGNOSIS — N943 Premenstrual tension syndrome: Secondary | ICD-10-CM | POA: Diagnosis not present

## 2021-07-15 DIAGNOSIS — Z01419 Encounter for gynecological examination (general) (routine) without abnormal findings: Secondary | ICD-10-CM | POA: Diagnosis not present

## 2021-07-18 ENCOUNTER — Telehealth: Payer: Self-pay

## 2021-07-18 NOTE — Telephone Encounter (Signed)
Pt Zoe Miles reporting [facility] has not contacted her for refill of coumadin and pt is wondering if it was sent in.  [facility]ed [facility] and spoke with Olegario Messier who report they did receive script on 9/20 w/one refill and pt is due again on 10/19 but they will get the refill ready now for pt. Zoe Miles letting pt know refill is at pharmacy.

## 2021-07-29 DIAGNOSIS — I8312 Varicose veins of left lower extremity with inflammation: Secondary | ICD-10-CM | POA: Diagnosis not present

## 2021-07-29 DIAGNOSIS — I8311 Varicose veins of right lower extremity with inflammation: Secondary | ICD-10-CM | POA: Diagnosis not present

## 2021-07-29 DIAGNOSIS — I83813 Varicose veins of bilateral lower extremities with pain: Secondary | ICD-10-CM | POA: Diagnosis not present

## 2021-08-02 ENCOUNTER — Ambulatory Visit: Payer: BC Managed Care – PPO

## 2021-08-02 ENCOUNTER — Ambulatory Visit (INDEPENDENT_AMBULATORY_CARE_PROVIDER_SITE_OTHER): Payer: BC Managed Care – PPO

## 2021-08-02 ENCOUNTER — Other Ambulatory Visit: Payer: Self-pay

## 2021-08-02 DIAGNOSIS — Z7901 Long term (current) use of anticoagulants: Secondary | ICD-10-CM | POA: Diagnosis not present

## 2021-08-02 LAB — POCT INR: INR: 2.4 (ref 2.0–3.0)

## 2021-08-02 NOTE — Patient Instructions (Addendum)
Pre visit review using our clinic review tool, if applicable. No additional management support is needed unless otherwise documented below in the visit note.  Continue to take 3 tablets daily except 2 tablets on Sundays and Thursdays.  Re-check in 6 weeks.

## 2021-08-02 NOTE — Progress Notes (Signed)
Continue to take 3 tablets daily except 2 tablets on Sundays and Thursdays.  Re-check in 6 weeks.

## 2021-09-13 ENCOUNTER — Ambulatory Visit (INDEPENDENT_AMBULATORY_CARE_PROVIDER_SITE_OTHER): Payer: BC Managed Care – PPO

## 2021-09-13 ENCOUNTER — Other Ambulatory Visit: Payer: Self-pay

## 2021-09-13 DIAGNOSIS — Z7901 Long term (current) use of anticoagulants: Secondary | ICD-10-CM | POA: Diagnosis not present

## 2021-09-13 LAB — POCT INR: INR: 4.3 — AB (ref 2.0–3.0)

## 2021-09-13 NOTE — Progress Notes (Signed)
Hold dose today and only take 1 tablet tomorrow and then change weekly  dose to take 2 tablets daily except take 3 tablets on Sundays and Thursdays.  Re-check in 2 weeks.

## 2021-09-13 NOTE — Patient Instructions (Addendum)
Pre visit review using our clinic review tool, if applicable. No additional management support is needed unless otherwise documented below in the visit note.  Hold dose today and only take 1 tablet tomorrow and then change weekly  dose to take 2 tablets daily except take 3 tablets on Sundays and Thursdays.  Re-check in 2 weeks.

## 2021-09-27 ENCOUNTER — Ambulatory Visit (INDEPENDENT_AMBULATORY_CARE_PROVIDER_SITE_OTHER): Payer: BC Managed Care – PPO

## 2021-09-27 ENCOUNTER — Other Ambulatory Visit: Payer: Self-pay

## 2021-09-27 DIAGNOSIS — Z7901 Long term (current) use of anticoagulants: Secondary | ICD-10-CM

## 2021-09-27 DIAGNOSIS — D6862 Lupus anticoagulant syndrome: Secondary | ICD-10-CM | POA: Diagnosis not present

## 2021-09-27 LAB — POCT INR: INR: 3.1 — AB (ref 2.0–3.0)

## 2021-09-27 MED ORDER — WARFARIN SODIUM 5 MG PO TABS: ORAL_TABLET | ORAL | 1 refills | Status: DC

## 2021-09-27 NOTE — Patient Instructions (Addendum)
Pre visit review using our clinic review tool, if applicable. No additional management support is needed unless otherwise documented below in the visit note.  Only take 1 tablet today and then continue 2 tablets daily except take 3 tablets on Sundays and Thursdays.  Re-check in 6 weeks.

## 2021-09-27 NOTE — Progress Notes (Signed)
Only take 1 tablet today and then continue 2 tablets daily except take 3 tablets on Sundays and Thursdays.  Re-check in 6 weeks.

## 2021-10-04 DIAGNOSIS — M5431 Sciatica, right side: Secondary | ICD-10-CM | POA: Diagnosis not present

## 2021-10-04 DIAGNOSIS — M9903 Segmental and somatic dysfunction of lumbar region: Secondary | ICD-10-CM | POA: Diagnosis not present

## 2021-10-17 DIAGNOSIS — M5431 Sciatica, right side: Secondary | ICD-10-CM | POA: Diagnosis not present

## 2021-10-17 DIAGNOSIS — M9903 Segmental and somatic dysfunction of lumbar region: Secondary | ICD-10-CM | POA: Diagnosis not present

## 2021-11-01 DIAGNOSIS — M9903 Segmental and somatic dysfunction of lumbar region: Secondary | ICD-10-CM | POA: Diagnosis not present

## 2021-11-01 DIAGNOSIS — M5431 Sciatica, right side: Secondary | ICD-10-CM | POA: Diagnosis not present

## 2021-11-08 ENCOUNTER — Other Ambulatory Visit: Payer: Self-pay

## 2021-11-08 ENCOUNTER — Ambulatory Visit (INDEPENDENT_AMBULATORY_CARE_PROVIDER_SITE_OTHER): Payer: BC Managed Care – PPO

## 2021-11-08 DIAGNOSIS — Z7901 Long term (current) use of anticoagulants: Secondary | ICD-10-CM | POA: Diagnosis not present

## 2021-11-08 LAB — POCT INR: INR: 2.2 (ref 2.0–3.0)

## 2021-11-08 NOTE — Patient Instructions (Addendum)
Pre visit review using our clinic review tool, if applicable. No additional management support is needed unless otherwise documented below in the visit note.  Continue 2 tablets daily except take 3 tablets on Sundays and Thursdays. Recheck in 6 weeks. 

## 2021-11-08 NOTE — Progress Notes (Signed)
Continue 2 tablets daily except take 3 tablets on Sundays and Thursdays. Recheck in 6 weeks. 

## 2021-11-11 DIAGNOSIS — I83812 Varicose veins of left lower extremities with pain: Secondary | ICD-10-CM | POA: Diagnosis not present

## 2021-11-11 DIAGNOSIS — I8312 Varicose veins of left lower extremity with inflammation: Secondary | ICD-10-CM | POA: Diagnosis not present

## 2021-11-14 DIAGNOSIS — I8312 Varicose veins of left lower extremity with inflammation: Secondary | ICD-10-CM | POA: Diagnosis not present

## 2021-11-18 DIAGNOSIS — I83811 Varicose veins of right lower extremities with pain: Secondary | ICD-10-CM | POA: Diagnosis not present

## 2021-11-18 DIAGNOSIS — I8311 Varicose veins of right lower extremity with inflammation: Secondary | ICD-10-CM | POA: Diagnosis not present

## 2021-11-22 DIAGNOSIS — M9903 Segmental and somatic dysfunction of lumbar region: Secondary | ICD-10-CM | POA: Diagnosis not present

## 2021-11-22 DIAGNOSIS — I8311 Varicose veins of right lower extremity with inflammation: Secondary | ICD-10-CM | POA: Diagnosis not present

## 2021-11-22 DIAGNOSIS — M5431 Sciatica, right side: Secondary | ICD-10-CM | POA: Diagnosis not present

## 2021-12-02 DIAGNOSIS — I8312 Varicose veins of left lower extremity with inflammation: Secondary | ICD-10-CM | POA: Diagnosis not present

## 2021-12-02 DIAGNOSIS — I8311 Varicose veins of right lower extremity with inflammation: Secondary | ICD-10-CM | POA: Diagnosis not present

## 2021-12-05 DIAGNOSIS — I8312 Varicose veins of left lower extremity with inflammation: Secondary | ICD-10-CM | POA: Diagnosis not present

## 2021-12-05 DIAGNOSIS — I8311 Varicose veins of right lower extremity with inflammation: Secondary | ICD-10-CM | POA: Diagnosis not present

## 2021-12-12 DIAGNOSIS — M5431 Sciatica, right side: Secondary | ICD-10-CM | POA: Diagnosis not present

## 2021-12-12 DIAGNOSIS — M9903 Segmental and somatic dysfunction of lumbar region: Secondary | ICD-10-CM | POA: Diagnosis not present

## 2021-12-16 DIAGNOSIS — I8311 Varicose veins of right lower extremity with inflammation: Secondary | ICD-10-CM | POA: Diagnosis not present

## 2021-12-16 DIAGNOSIS — M7981 Nontraumatic hematoma of soft tissue: Secondary | ICD-10-CM | POA: Diagnosis not present

## 2021-12-16 DIAGNOSIS — I8312 Varicose veins of left lower extremity with inflammation: Secondary | ICD-10-CM | POA: Diagnosis not present

## 2021-12-19 DIAGNOSIS — I8312 Varicose veins of left lower extremity with inflammation: Secondary | ICD-10-CM | POA: Diagnosis not present

## 2021-12-19 DIAGNOSIS — M25511 Pain in right shoulder: Secondary | ICD-10-CM | POA: Diagnosis not present

## 2021-12-19 DIAGNOSIS — R760 Raised antibody titer: Secondary | ICD-10-CM | POA: Diagnosis not present

## 2021-12-19 DIAGNOSIS — M064 Inflammatory polyarthropathy: Secondary | ICD-10-CM | POA: Diagnosis not present

## 2021-12-19 DIAGNOSIS — M25571 Pain in right ankle and joints of right foot: Secondary | ICD-10-CM | POA: Diagnosis not present

## 2021-12-19 DIAGNOSIS — I8311 Varicose veins of right lower extremity with inflammation: Secondary | ICD-10-CM | POA: Diagnosis not present

## 2021-12-20 ENCOUNTER — Other Ambulatory Visit: Payer: Self-pay

## 2021-12-20 ENCOUNTER — Ambulatory Visit (INDEPENDENT_AMBULATORY_CARE_PROVIDER_SITE_OTHER): Payer: BC Managed Care – PPO

## 2021-12-20 DIAGNOSIS — Z7901 Long term (current) use of anticoagulants: Secondary | ICD-10-CM

## 2021-12-20 LAB — POCT INR: INR: 2.4 (ref 2.0–3.0)

## 2021-12-20 NOTE — Patient Instructions (Addendum)
Pre visit review using our clinic review tool, if applicable. No additional management support is needed unless otherwise documented below in the visit note.  Continue 2 tablets daily except take 3 tablets on Sundays and Thursdays. Recheck in 6 weeks. 

## 2021-12-20 NOTE — Progress Notes (Signed)
Continue 2 tablets daily except take 3 tablets on Sundays and Thursdays. Recheck in 6 weeks. 

## 2021-12-23 DIAGNOSIS — I8312 Varicose veins of left lower extremity with inflammation: Secondary | ICD-10-CM | POA: Diagnosis not present

## 2021-12-26 DIAGNOSIS — M5431 Sciatica, right side: Secondary | ICD-10-CM | POA: Diagnosis not present

## 2021-12-26 DIAGNOSIS — M9903 Segmental and somatic dysfunction of lumbar region: Secondary | ICD-10-CM | POA: Diagnosis not present

## 2022-01-02 DIAGNOSIS — M5431 Sciatica, right side: Secondary | ICD-10-CM | POA: Diagnosis not present

## 2022-01-02 DIAGNOSIS — M9903 Segmental and somatic dysfunction of lumbar region: Secondary | ICD-10-CM | POA: Diagnosis not present

## 2022-01-16 DIAGNOSIS — M9903 Segmental and somatic dysfunction of lumbar region: Secondary | ICD-10-CM | POA: Diagnosis not present

## 2022-01-16 DIAGNOSIS — M5431 Sciatica, right side: Secondary | ICD-10-CM | POA: Diagnosis not present

## 2022-02-03 ENCOUNTER — Ambulatory Visit (INDEPENDENT_AMBULATORY_CARE_PROVIDER_SITE_OTHER): Payer: BC Managed Care – PPO

## 2022-02-03 DIAGNOSIS — Z7901 Long term (current) use of anticoagulants: Secondary | ICD-10-CM

## 2022-02-03 LAB — POCT INR: INR: 2 (ref 2.0–3.0)

## 2022-02-03 NOTE — Progress Notes (Signed)
Continue 2 tablets daily except take 3 tablets on Sundays and Thursdays. Recheck in 6 weeks. 

## 2022-02-03 NOTE — Patient Instructions (Addendum)
Pre visit review using our clinic review tool, if applicable. No additional management support is needed unless otherwise documented below in the visit note.  Continue 2 tablets daily except take 3 tablets on Sundays and Thursdays. Recheck in 6 weeks. 

## 2022-02-13 DIAGNOSIS — M9903 Segmental and somatic dysfunction of lumbar region: Secondary | ICD-10-CM | POA: Diagnosis not present

## 2022-02-13 DIAGNOSIS — M5431 Sciatica, right side: Secondary | ICD-10-CM | POA: Diagnosis not present

## 2022-02-13 DIAGNOSIS — H10413 Chronic giant papillary conjunctivitis, bilateral: Secondary | ICD-10-CM | POA: Diagnosis not present

## 2022-02-28 DIAGNOSIS — M9903 Segmental and somatic dysfunction of lumbar region: Secondary | ICD-10-CM | POA: Diagnosis not present

## 2022-02-28 DIAGNOSIS — M5431 Sciatica, right side: Secondary | ICD-10-CM | POA: Diagnosis not present

## 2022-03-08 ENCOUNTER — Encounter: Payer: Self-pay | Admitting: Internal Medicine

## 2022-03-08 ENCOUNTER — Ambulatory Visit (INDEPENDENT_AMBULATORY_CARE_PROVIDER_SITE_OTHER): Payer: BC Managed Care – PPO | Admitting: Internal Medicine

## 2022-03-08 DIAGNOSIS — D6862 Lupus anticoagulant syndrome: Secondary | ICD-10-CM

## 2022-03-08 DIAGNOSIS — F3281 Premenstrual dysphoric disorder: Secondary | ICD-10-CM | POA: Insufficient documentation

## 2022-03-08 DIAGNOSIS — Z0001 Encounter for general adult medical examination with abnormal findings: Secondary | ICD-10-CM

## 2022-03-08 DIAGNOSIS — Z6837 Body mass index (BMI) 37.0-37.9, adult: Secondary | ICD-10-CM

## 2022-03-08 LAB — BASIC METABOLIC PANEL
BUN: 12 mg/dL (ref 6–23)
CO2: 26 mEq/L (ref 19–32)
Calcium: 9 mg/dL (ref 8.4–10.5)
Chloride: 104 mEq/L (ref 96–112)
Creatinine, Ser: 0.68 mg/dL (ref 0.40–1.20)
GFR: 105.84 mL/min (ref >60.00)
Glucose, Bld: 82 mg/dL (ref 70–99)
Potassium: 3.9 mEq/L (ref 3.5–5.1)
Sodium: 138 mEq/L (ref 135–145)

## 2022-03-08 LAB — HEPATIC FUNCTION PANEL
ALT: 17 U/L (ref 0–35)
AST: 19 U/L (ref 0–37)
Albumin: 4.3 g/dL (ref 3.5–5.2)
Alkaline Phosphatase: 32 U/L — ABNORMAL LOW (ref 39–117)
Bilirubin, Direct: 0.1 mg/dL (ref 0.0–0.3)
Total Bilirubin: 0.5 mg/dL (ref 0.2–1.2)
Total Protein: 6.9 g/dL (ref 6.0–8.3)

## 2022-03-08 LAB — CBC WITH DIFFERENTIAL/PLATELET
Basophils Absolute: 0 10*3/uL (ref 0.0–0.1)
Basophils Relative: 0.9 % (ref 0.0–3.0)
Eosinophils Absolute: 0.1 10*3/uL (ref 0.0–0.7)
Eosinophils Relative: 2.7 % (ref 0.0–5.0)
HCT: 40.6 % (ref 36.0–46.0)
Hemoglobin: 13.6 g/dL (ref 12.0–15.0)
Lymphocytes Relative: 29.4 % (ref 12.0–46.0)
Lymphs Abs: 1.3 10*3/uL (ref 0.7–4.0)
MCHC: 33.4 g/dL (ref 30.0–36.0)
MCV: 90.2 fl (ref 78.0–100.0)
Monocytes Absolute: 0.5 10*3/uL (ref 0.1–1.0)
Monocytes Relative: 10.5 % (ref 3.0–12.0)
Neutro Abs: 2.6 10*3/uL (ref 1.4–7.7)
Neutrophils Relative %: 56.5 % (ref 43.0–77.0)
Platelets: 200 10*3/uL (ref 150.0–400.0)
RBC: 4.5 Mil/uL (ref 3.87–5.11)
RDW: 14.3 % (ref 11.5–15.5)
WBC: 4.6 10*3/uL (ref 4.0–10.5)

## 2022-03-08 LAB — HEMOGLOBIN A1C: Hgb A1c MFr Bld: 5.5 % (ref 4.6–6.5)

## 2022-03-08 LAB — LIPID PANEL
Cholesterol: 123 mg/dL (ref 0–200)
HDL: 66.5 mg/dL (ref >39.00)
LDL Cholesterol: 41 mg/dL (ref 0–99)
NonHDL: 56.91
Total CHOL/HDL Ratio: 2
Triglycerides: 82 mg/dL (ref 0.0–149.0)
VLDL: 16.4 mg/dL (ref 0.0–40.0)

## 2022-03-08 LAB — TSH: TSH: 1.98 u[IU]/mL (ref 0.35–5.50)

## 2022-03-08 MED ORDER — SEMAGLUTIDE-WEIGHT MANAGEMENT 1.7 MG/0.75ML ~~LOC~~ SOAJ: 1.7000 mg | SUBCUTANEOUS | 0 refills | Status: DC

## 2022-03-08 MED ORDER — DULOXETINE HCL 30 MG PO CPEP: 30.0000 mg | ORAL_CAPSULE | Freq: Every day | ORAL | 0 refills | Status: DC

## 2022-03-08 MED ORDER — SEMAGLUTIDE-WEIGHT MANAGEMENT 0.25 MG/0.5ML ~~LOC~~ SOAJ: 0.2500 mg | SUBCUTANEOUS | 0 refills | Status: DC

## 2022-03-08 MED ORDER — SEMAGLUTIDE-WEIGHT MANAGEMENT 2.4 MG/0.75ML ~~LOC~~ SOAJ: 2.4000 mg | SUBCUTANEOUS | 0 refills | Status: DC

## 2022-03-08 MED ORDER — SEMAGLUTIDE-WEIGHT MANAGEMENT 0.5 MG/0.5ML ~~LOC~~ SOAJ: 0.5000 mg | SUBCUTANEOUS | 0 refills | Status: DC

## 2022-03-08 MED ORDER — SEMAGLUTIDE-WEIGHT MANAGEMENT 1 MG/0.5ML ~~LOC~~ SOAJ: 1.0000 mg | SUBCUTANEOUS | 0 refills | Status: DC

## 2022-03-08 NOTE — Patient Instructions (Signed)

## 2022-03-08 NOTE — Progress Notes (Signed)
Subjective:  Patient ID: Zoe Miles, female    DOB: 21-Mar-1976  Age: 46 y.o. MRN: [id number]  CC: Annual Exam   HPI Zoe Miles presents for f/up and a CPX-  She wants to start a medication for weight loss.  She also complains of menstrual dysphoric disorder.  Someone else prescribed 25 mg of sertraline but it has not been helpful.  Around the time of her cycle she feels angry, irritable, and moody.  Outpatient Medications Prior to Visit  Medication Sig Dispense Refill   cycloSPORINE (RESTASIS) 0.05 % ophthalmic emulsion Place 1 drop into both eyes 2 (two) times daily.     hydroxychloroquine (PLAQUENIL) 200 MG tablet Take 200 mg by mouth 2 (two) times daily.      Insulin Pen Needle 32G X 6 MM MISC 1 Act by Does not apply route once a week. 50 each 0   norethindrone (MICRONOR,CAMILA,ERRIN) 0.35 MG tablet Take 1 tablet by mouth daily.     Omega-3 Fatty Acids (FISH OIL PO) Take 1 capsule by mouth daily.      warfarin (COUMADIN) 5 MG tablet TAKE 2 TABLETS DAILY BY MOUTH EXCEPT TAKE 3 TABLETS ON SUNDAYS AND THURSDAYS OR AS DIRECTED BY ANTICOAGULATION CLINIC 240 tablet 1   enoxaparin (LOVENOX) 150 MG/ML injection Inject 1 mL (150 mg total) into the skin daily for 7 doses. 7 mL 0   No facility-administered medications prior to visit.    ROS Review of Systems  Constitutional:  Positive for unexpected weight change (wt gain). Negative for appetite change, chills, diaphoresis and fatigue.  HENT: Negative.    Eyes: Negative.   Respiratory:  Negative for cough, chest tightness, shortness of breath and wheezing.   Cardiovascular:  Negative for chest pain, palpitations and leg swelling.  Gastrointestinal:  Negative for abdominal pain, constipation, diarrhea, nausea and vomiting.  Genitourinary: Negative.   Musculoskeletal: Negative.  Negative for arthralgias.  Skin: Negative.   Neurological:  Negative for dizziness, weakness and headaches.  Hematological:  Negative for  adenopathy. Does not bruise/bleed easily.  Psychiatric/Behavioral:  Positive for dysphoric mood. Negative for confusion and sleep disturbance. The patient is not nervous/anxious.     Objective:  BP 116/78 (BP Location: Right Arm, Patient Position: Sitting, Cuff Size: Large)   Pulse 71   Temp 98.1 F (36.7 C) (Oral)   Ht 5\' 7"  (1.702 m)   Wt 239 lb (108.4 kg)   SpO2 98%   BMI 37.43 kg/m   BP Readings from Last 3 Encounters:  03/08/22 116/78  07/13/20 126/86  09/24/19 108/74    Wt Readings from Last 3 Encounters:  03/08/22 239 lb (108.4 kg)  07/13/20 220 lb (99.8 kg)  09/24/19 201 lb (91.2 kg)    Physical Exam Vitals reviewed.  HENT:     Nose: Nose normal.     Mouth/Throat:     Mouth: Mucous membranes are moist.  Eyes:     Conjunctiva/sclera: Conjunctivae normal.  Cardiovascular:     Rate and Rhythm: Normal rate and regular rhythm.     Heart sounds: No murmur heard. Pulmonary:     Effort: Pulmonary effort is normal.     Breath sounds: No stridor. No wheezing, rhonchi or rales.  Abdominal:     General: Abdomen is flat.     Palpations: There is no mass.     Tenderness: There is no abdominal tenderness. There is no guarding.     Hernia: No hernia is present.  Musculoskeletal:  General: Normal range of motion.     Cervical back: Neck supple.     Right lower leg: No edema.     Left lower leg: No edema.  Lymphadenopathy:     Cervical: No cervical adenopathy.  Skin:    General: Skin is warm and dry.  Neurological:     General: No focal deficit present.     Mental Status: She is alert. Mental status is at baseline.  Psychiatric:        Mood and Affect: Mood normal.        Behavior: Behavior normal.        Thought Content: Thought content normal.        Judgment: Judgment normal.     Lab Results  Component Value Date   WBC 4.6 03/08/2022   HGB 13.6 03/08/2022   HCT 40.6 03/08/2022   PLT 200.0 03/08/2022   GLUCOSE 82 03/08/2022   CHOL 123 03/08/2022    TRIG 82.0 03/08/2022   HDL 66.50 03/08/2022   LDLCALC 41 03/08/2022   ALT 17 03/08/2022   AST 19 03/08/2022   NA 138 03/08/2022   K 3.9 03/08/2022   CL 104 03/08/2022   CREATININE 0.68 03/08/2022   BUN 12 03/08/2022   CO2 26 03/08/2022   TSH 1.98 03/08/2022   INR 2.0 02/03/2022   HGBA1C 5.5 03/08/2022    MM 3D SCREEN BREAST BILATERAL  Result Date: 05/18/2021 CLINICAL DATA:  Screening. EXAM: DIGITAL SCREENING BILATERAL MAMMOGRAM WITH TOMOSYNTHESIS AND CAD TECHNIQUE: Bilateral screening digital craniocaudal and mediolateral oblique mammograms were obtained. Bilateral screening digital breast tomosynthesis was performed. The images were evaluated with computer-aided detection. COMPARISON:  Previous exam(s). ACR Breast Density Category b: There are scattered areas of fibroglandular density. FINDINGS: There are no findings suspicious for malignancy. IMPRESSION: No mammographic evidence of malignancy. A result letter of this screening mammogram will be mailed directly to the patient. RECOMMENDATION: Screening mammogram in one year. (Code:SM-B-01Y) BI-RADS CATEGORY  1: Negative. Electronically Signed   By: Sherian Rein M.D.   On: 05/18/2021 10:49    Assessment & Plan:   Zoe Miles was seen today for annual exam.  Diagnoses and all orders for this visit:  Class 2 severe obesity due to excess calories with serious comorbidity and body mass index (BMI) of 37.0 to 37.9 in adult [facility])- Labs are negative for secondary causes. -     Basic metabolic panel; Future -     CBC with Differential/Platelet; Future -     Hepatic function panel; Future -     TSH; Future -     Hemoglobin A1c; Future -     Hemoglobin A1c -     TSH -     Hepatic function panel -     CBC with Differential/Platelet -     Basic metabolic panel -     Semaglutide-Weight Management 0.25 MG/0.5ML SOAJ; Inject 0.25 mg into the skin once a week for 28 days. -     Semaglutide-Weight Management 1 MG/0.5ML SOAJ; Inject 1 mg into the  skin once a week for 28 days. -     Semaglutide-Weight Management 1.7 MG/0.75ML SOAJ; Inject 1.7 mg into the skin once a week for 28 days. -     Semaglutide-Weight Management 2.4 MG/0.75ML SOAJ; Inject 2.4 mg into the skin once a week for 28 days. -     Semaglutide-Weight Management 0.5 MG/0.5ML SOAJ; Inject 0.5 mg into the skin once a week for 28 days.  Encounter for  general adult medical examination with abnormal findings- Exam completed, labs reviewed, vaccines are up-to-date, cancer screenings are up-to-date, patient education material was given. -     Lipid panel; Future -     Lipid panel  Lupus anticoagulant with hypercoagulable state (HCC) -     Basic metabolic panel; Future -     CBC with Differential/Platelet; Future -     Hepatic function panel; Future -     Hepatic function panel -     CBC with Differential/Platelet -     Basic metabolic panel  PMDD (premenstrual dysphoric disorder)- Labs are negative for secondary causes.  We will start duloxetine. -     DULoxetine (CYMBALTA) 30 MG capsule; Take 1 capsule (30 mg total) by mouth daily.   I am having Zoe Miles start on DULoxetine, Semaglutide-Weight Management, Semaglutide-Weight Management, Semaglutide-Weight Management, Semaglutide-Weight Management, and Semaglutide-Weight Management. I am also having her maintain her hydroxychloroquine, Omega-3 Fatty Acids (FISH OIL PO), norethindrone, cycloSPORINE, enoxaparin, Insulin Pen Needle, and warfarin.  Meds ordered this encounter  Medications   DULoxetine (CYMBALTA) 30 MG capsule    Sig: Take 1 capsule (30 mg total) by mouth daily.    Dispense:  30 capsule    Refill:  0   Semaglutide-Weight Management 0.25 MG/0.5ML SOAJ    Sig: Inject 0.25 mg into the skin once a week for 28 days.    Dispense:  2 mL    Refill:  0   Semaglutide-Weight Management 1 MG/0.5ML SOAJ    Sig: Inject 1 mg into the skin once a week for 28 days.    Dispense:  2 mL    Refill:  0    Semaglutide-Weight Management 1.7 MG/0.75ML SOAJ    Sig: Inject 1.7 mg into the skin once a week for 28 days.    Dispense:  3 mL    Refill:  0   Semaglutide-Weight Management 2.4 MG/0.75ML SOAJ    Sig: Inject 2.4 mg into the skin once a week for 28 days.    Dispense:  3 mL    Refill:  0   Semaglutide-Weight Management 0.5 MG/0.5ML SOAJ    Sig: Inject 0.5 mg into the skin once a week for 28 days.    Dispense:  2 mL    Refill:  0     Follow-up: Return in about 6 months (around 09/07/2022).  Sanda Lingerhomas Shandy Checo, MD

## 2022-03-10 ENCOUNTER — Encounter: Payer: Self-pay | Admitting: Internal Medicine

## 2022-03-13 ENCOUNTER — Telehealth: Payer: Self-pay

## 2022-03-13 DIAGNOSIS — M9903 Segmental and somatic dysfunction of lumbar region: Secondary | ICD-10-CM | POA: Diagnosis not present

## 2022-03-13 DIAGNOSIS — M5431 Sciatica, right side: Secondary | ICD-10-CM | POA: Diagnosis not present

## 2022-03-13 NOTE — Telephone Encounter (Signed)
Key: [id number]

## 2022-03-14 ENCOUNTER — Other Ambulatory Visit: Payer: Self-pay | Admitting: Internal Medicine

## 2022-03-14 NOTE — Telephone Encounter (Signed)
Approved 03/14/22 - 10/14/22

## 2022-03-17 ENCOUNTER — Ambulatory Visit (INDEPENDENT_AMBULATORY_CARE_PROVIDER_SITE_OTHER): Payer: BC Managed Care – PPO

## 2022-03-17 DIAGNOSIS — Z7901 Long term (current) use of anticoagulants: Secondary | ICD-10-CM | POA: Diagnosis not present

## 2022-03-17 LAB — POCT INR: INR: 2.5 (ref 2.0–3.0)

## 2022-03-17 NOTE — Progress Notes (Addendum)
Pt will have eyebrow tattooing on 7/28 so she will return for INR check on 7/21 for bridge instructions.  Pt was taken off of sertraline and prescribed Cymbalta. Also started [facility].  Continue 2 tablets daily except take 3 tablets on Sundays and Thursdays.  Re-check in 6 weeks.

## 2022-03-17 NOTE — Patient Instructions (Addendum)
Pre visit review using our clinic review tool, if applicable. No additional management support is needed unless otherwise documented below in the visit note.  Continue 2 tablets daily except take 3 tablets on Sundays and Thursdays. Recheck in 6 weeks. 

## 2022-03-23 ENCOUNTER — Other Ambulatory Visit: Payer: Self-pay | Admitting: Internal Medicine

## 2022-03-23 MED ORDER — TIRZEPATIDE 2.5 MG/0.5ML ~~LOC~~ SOAJ: 2.5000 mg | SUBCUTANEOUS | 0 refills | Status: DC

## 2022-03-24 ENCOUNTER — Telehealth: Payer: Self-pay | Admitting: *Deleted

## 2022-03-24 NOTE — Telephone Encounter (Signed)
Pt was on cover-my-meds need PA for [facility]. Submitted PA w/ Key: [id number]. Rec'd msg " Your PA request has been sent to [employer] ...Raechel Chute

## 2022-03-27 NOTE — Telephone Encounter (Signed)
Rec'd determination fax med was was DENIED. It states your plan only covered use is type 2 diabetes .Marland KitchenRaechel Chute

## 2022-03-31 DIAGNOSIS — I83813 Varicose veins of bilateral lower extremities with pain: Secondary | ICD-10-CM | POA: Diagnosis not present

## 2022-04-02 ENCOUNTER — Other Ambulatory Visit: Payer: Self-pay | Admitting: Internal Medicine

## 2022-04-02 DIAGNOSIS — F3281 Premenstrual dysphoric disorder: Secondary | ICD-10-CM

## 2022-04-06 ENCOUNTER — Other Ambulatory Visit: Payer: Self-pay | Admitting: Internal Medicine

## 2022-04-06 DIAGNOSIS — M5431 Sciatica, right side: Secondary | ICD-10-CM | POA: Diagnosis not present

## 2022-04-06 DIAGNOSIS — Z1231 Encounter for screening mammogram for malignant neoplasm of breast: Secondary | ICD-10-CM

## 2022-04-06 DIAGNOSIS — M9903 Segmental and somatic dysfunction of lumbar region: Secondary | ICD-10-CM | POA: Diagnosis not present

## 2022-04-18 ENCOUNTER — Telehealth: Payer: Self-pay

## 2022-04-18 ENCOUNTER — Other Ambulatory Visit: Payer: Self-pay | Admitting: Internal Medicine

## 2022-04-18 DIAGNOSIS — D6862 Lupus anticoagulant syndrome: Secondary | ICD-10-CM

## 2022-04-18 NOTE — Telephone Encounter (Signed)
LVM to inquire if pt wants lovenox sent in before her apt on Fri.

## 2022-04-18 NOTE — Telephone Encounter (Addendum)
Pt is having eyebrow micro-blade procedure on 7/28 and will need a lovenox bridge.  Wt: 108 kg CrCl: 211.76 mL/min  Lovenox, 150 mg, QD, 9 syringes Coumadin clinic apt on 04/21/22   7/23: Take last dose of coumadin 7/24: No coumadin, No Lovenox 7/25: No coumadin, Lovenox AM 7/26: No coumadin, Lovenox AM 7/27: No coumadin, Lovenox AM (BEFORE 7 AM)  7/28: PROCEDURE DAY; NO COUMADIN, NO LOVENOX  7/29: Take 3 tablets (15 mg) coumadin, Lovenox AM 7/30: Take 4 tablets (20 mg) coumadin, Lovenox AM 7/31: Take 3 tablets (15 mg) coumadin, Lovenox AM 8/1: Take 3 tablets (15 mg) coumadin, Lovenox AM 8/2: Take 2 tablets (10 mg) coumadin, Lovenox AM 8/3: Take 3 tablets (15 mg) coumadin, Lovenox AM  8/4: Recheck INR, do not take coumadin or lovenox until after INR check

## 2022-04-19 MED ORDER — ENOXAPARIN SODIUM 150 MG/ML IJ SOSY: 150.0000 mg | PREFILLED_SYRINGE | INTRAMUSCULAR | 0 refills | Status: DC

## 2022-04-19 NOTE — Telephone Encounter (Signed)
Received VM pt would like lovenox sent in now. Sent in script.

## 2022-04-21 ENCOUNTER — Ambulatory Visit (INDEPENDENT_AMBULATORY_CARE_PROVIDER_SITE_OTHER): Payer: BC Managed Care – PPO

## 2022-04-21 ENCOUNTER — Encounter: Payer: Self-pay | Admitting: Internal Medicine

## 2022-04-21 DIAGNOSIS — Z7901 Long term (current) use of anticoagulants: Secondary | ICD-10-CM | POA: Diagnosis not present

## 2022-04-21 LAB — POCT INR: INR: 2.9 (ref 2.0–3.0)

## 2022-04-21 NOTE — Patient Instructions (Addendum)
Pre visit review using our clinic review tool, if applicable. No additional management support is needed unless otherwise documented below in the visit note.  Continue 2 tablets daily except take 3 tablets on Sundays and Thursdays until instructions below start.  7/23: Take last dose of coumadin 7/24: No coumadin, No Lovenox 7/25: No coumadin, Lovenox AM 7/26: No coumadin, Lovenox AM 7/27: No coumadin, Lovenox AM (BEFORE 7 AM)   7/28: PROCEDURE DAY; NO COUMADIN, NO LOVENOX   7/29: Take 3 tablets (15 mg) coumadin, Lovenox AM 7/30: Take 4 tablets (20 mg) coumadin, Lovenox AM 7/31: Take 3 tablets (15 mg) coumadin, Lovenox AM 8/1: Take 3 tablets (15 mg) coumadin, Lovenox AM 8/2: Take 2 tablets (10 mg) coumadin, Lovenox AM 8/3: Take 3 tablets (15 mg) coumadin, Lovenox AM   8/4: Recheck INR, do not take coumadin or lovenox until after INR check

## 2022-04-21 NOTE — Progress Notes (Signed)
  Pt is having micro-blade eyebrow procedure on 7/28 and will be on a lovenox bridge.  7/23: Take last dose of coumadin 7/24: No coumadin, No Lovenox 7/25: No coumadin, Lovenox AM 7/26: No coumadin, Lovenox AM 7/27: No coumadin, Lovenox AM (BEFORE 7 AM)   7/28: PROCEDURE DAY; NO COUMADIN, NO LOVENOX   7/29: Take 3 tablets (15 mg) coumadin, Lovenox AM 7/30: Take 4 tablets (20 mg) coumadin, Lovenox AM 7/31: Take 3 tablets (15 mg) coumadin, Lovenox AM 8/1: Take 3 tablets (15 mg) coumadin, Lovenox AM 8/2: Take 2 tablets (10 mg) coumadin, Lovenox AM 8/3: Take 3 tablets (15 mg) coumadin, Lovenox AM   8/4: Recheck INR, do not take coumadin or lovenox until after INR check

## 2022-04-25 DIAGNOSIS — M9903 Segmental and somatic dysfunction of lumbar region: Secondary | ICD-10-CM | POA: Diagnosis not present

## 2022-04-25 DIAGNOSIS — M5431 Sciatica, right side: Secondary | ICD-10-CM | POA: Diagnosis not present

## 2022-05-05 ENCOUNTER — Ambulatory Visit (INDEPENDENT_AMBULATORY_CARE_PROVIDER_SITE_OTHER): Payer: BC Managed Care – PPO

## 2022-05-05 DIAGNOSIS — Z7901 Long term (current) use of anticoagulants: Secondary | ICD-10-CM

## 2022-05-05 LAB — POCT INR: INR: 2.2 (ref 2.0–3.0)

## 2022-05-05 NOTE — Progress Notes (Signed)
Continue 2 tablets daily except take 3 tablets on Sundays and Thursdays. Recheck in 6 weeks. 

## 2022-05-05 NOTE — Patient Instructions (Addendum)
Pre visit review using our clinic review tool, if applicable. No additional management support is needed unless otherwise documented below in the visit note.  Continue 2 tablets daily except take 3 tablets on Sundays and Thursdays. Recheck in 6 weeks. 

## 2022-05-08 DIAGNOSIS — M5431 Sciatica, right side: Secondary | ICD-10-CM | POA: Diagnosis not present

## 2022-05-08 DIAGNOSIS — M9903 Segmental and somatic dysfunction of lumbar region: Secondary | ICD-10-CM | POA: Diagnosis not present

## 2022-05-15 DIAGNOSIS — M9903 Segmental and somatic dysfunction of lumbar region: Secondary | ICD-10-CM | POA: Diagnosis not present

## 2022-05-15 DIAGNOSIS — M5431 Sciatica, right side: Secondary | ICD-10-CM | POA: Diagnosis not present

## 2022-05-18 ENCOUNTER — Ambulatory Visit: Payer: BC Managed Care – PPO

## 2022-05-24 ENCOUNTER — Ambulatory Visit
Admission: RE | Admit: 2022-05-24 | Discharge: 2022-05-24 | Disposition: A | Discharge status: Home / Self Care | Payer: BC Managed Care – PPO | Source: Ambulatory Visit | Attending: Internal Medicine | Admitting: Internal Medicine

## 2022-05-24 DIAGNOSIS — Z1231 Encounter for screening mammogram for malignant neoplasm of breast: Secondary | ICD-10-CM | POA: Diagnosis not present

## 2022-05-27 ENCOUNTER — Encounter: Payer: Self-pay | Admitting: Internal Medicine

## 2022-05-29 MED ORDER — DULOXETINE HCL 60 MG PO CPEP: 60.0000 mg | ORAL_CAPSULE | Freq: Every day | ORAL | 1 refills | Status: DC

## 2022-05-31 DIAGNOSIS — M5431 Sciatica, right side: Secondary | ICD-10-CM | POA: Diagnosis not present

## 2022-05-31 DIAGNOSIS — M9903 Segmental and somatic dysfunction of lumbar region: Secondary | ICD-10-CM | POA: Diagnosis not present

## 2022-06-06 DIAGNOSIS — M9903 Segmental and somatic dysfunction of lumbar region: Secondary | ICD-10-CM | POA: Diagnosis not present

## 2022-06-06 DIAGNOSIS — M5431 Sciatica, right side: Secondary | ICD-10-CM | POA: Diagnosis not present

## 2022-06-08 DIAGNOSIS — M5431 Sciatica, right side: Secondary | ICD-10-CM | POA: Diagnosis not present

## 2022-06-08 DIAGNOSIS — M9903 Segmental and somatic dysfunction of lumbar region: Secondary | ICD-10-CM | POA: Diagnosis not present

## 2022-06-13 ENCOUNTER — Ambulatory Visit (INDEPENDENT_AMBULATORY_CARE_PROVIDER_SITE_OTHER): Payer: BC Managed Care – PPO

## 2022-06-13 DIAGNOSIS — Z7901 Long term (current) use of anticoagulants: Secondary | ICD-10-CM

## 2022-06-13 LAB — POCT INR: INR: 3 (ref 2.0–3.0)

## 2022-06-13 NOTE — Patient Instructions (Addendum)
Pre visit review using our clinic review tool, if applicable. No additional management support is needed unless otherwise documented below in the visit note.   Continue 2 tablets daily except take 3 tablets on Sundays and Thursdays. You will see Lovenox bridge instructions in MyChart this week.  Return on 10/6 at 8:00am.

## 2022-06-13 NOTE — Progress Notes (Signed)
Continue 2 tablets daily except take 3 tablets on Sundays and Thursdays.   Pt reports brow micro-blading procedure on 9/30 needing Lovenox bridge. INR Recheck on 10/6.

## 2022-06-14 ENCOUNTER — Telehealth: Payer: Self-pay

## 2022-06-14 NOTE — Telephone Encounter (Signed)
Patient self reports an eyebrow microblading procedure on 9/30. Below is the proposed Lovenox bridge:  Weight: 108.4 kg  CrCl: 180.68  Lovenox 150 mg daily. Need 8 syringes.   Continue 2 tablets daily except take 3 tablets on Sundays and Thursdays until you start the dosing schedule below.   9/25: Take last dose of coumadin  9/26: No coumadin, no Lovenox  9/27: No coumadin, Lovenox once in AM  9/28: No coumadin, Lovenox once in AM  9/29: No coumadin, Lovenox in AM (before 7 AM)     9/30: SURGERY; NO COUMADIN, NO LOVENOX     10 /1: Take 4 1/2 tablets (22.5 mg), Lovenox once in AM  10/2: Take 3 tablets (15 mg), Lovenox once in AM  10/3: Take 3 tablets (15 mg), Lovenox once in AM  10/4: Take 3 tablets (15 mg), Lovenox once in AM ` 10/5: Take 3 tablets (15 mg), Lovenox once in AM   10/6: Recheck INR at 8:00 am. No coumadin, no Lovenox until after INR check.

## 2022-06-15 MED ORDER — ENOXAPARIN SODIUM 150 MG/ML IJ SOSY: 150.0000 mg | PREFILLED_SYRINGE | INTRAMUSCULAR | 0 refills | Status: DC

## 2022-06-15 NOTE — Telephone Encounter (Signed)
Noted. Lovenox sent to pharmacy. Instructions sent to pt via MyChart per pt request.

## 2022-06-16 ENCOUNTER — Ambulatory Visit: Payer: BC Managed Care – PPO

## 2022-06-18 ENCOUNTER — Encounter: Payer: Self-pay | Admitting: Internal Medicine

## 2022-06-19 DIAGNOSIS — M25511 Pain in right shoulder: Secondary | ICD-10-CM | POA: Diagnosis not present

## 2022-06-19 DIAGNOSIS — M064 Inflammatory polyarthropathy: Secondary | ICD-10-CM | POA: Diagnosis not present

## 2022-06-19 DIAGNOSIS — R760 Raised antibody titer: Secondary | ICD-10-CM | POA: Diagnosis not present

## 2022-06-19 DIAGNOSIS — M9903 Segmental and somatic dysfunction of lumbar region: Secondary | ICD-10-CM | POA: Diagnosis not present

## 2022-06-19 DIAGNOSIS — M25571 Pain in right ankle and joints of right foot: Secondary | ICD-10-CM | POA: Diagnosis not present

## 2022-06-19 DIAGNOSIS — M5431 Sciatica, right side: Secondary | ICD-10-CM | POA: Diagnosis not present

## 2022-07-03 DIAGNOSIS — M9903 Segmental and somatic dysfunction of lumbar region: Secondary | ICD-10-CM | POA: Diagnosis not present

## 2022-07-03 DIAGNOSIS — M5431 Sciatica, right side: Secondary | ICD-10-CM | POA: Diagnosis not present

## 2022-07-07 ENCOUNTER — Ambulatory Visit (INDEPENDENT_AMBULATORY_CARE_PROVIDER_SITE_OTHER): Payer: BC Managed Care – PPO

## 2022-07-07 DIAGNOSIS — Z7901 Long term (current) use of anticoagulants: Secondary | ICD-10-CM

## 2022-07-07 LAB — POCT INR: INR: 2.2 (ref 2.0–3.0)

## 2022-07-07 NOTE — Progress Notes (Signed)
Continue 2 tablets daily except take 3 tablets on Sundays and Thursdays. Recheck in 6 weeks. 

## 2022-07-07 NOTE — Patient Instructions (Addendum)
Continue 2 tablets daily except take 3 tablets on Sundays and Thursdays. Recheck in 6 weeks. 

## 2022-07-10 ENCOUNTER — Encounter: Payer: Self-pay | Admitting: Internal Medicine

## 2022-07-14 ENCOUNTER — Ambulatory Visit: Payer: BC Managed Care – PPO | Admitting: Nurse Practitioner

## 2022-07-31 DIAGNOSIS — M5431 Sciatica, right side: Secondary | ICD-10-CM | POA: Diagnosis not present

## 2022-07-31 DIAGNOSIS — M9903 Segmental and somatic dysfunction of lumbar region: Secondary | ICD-10-CM | POA: Diagnosis not present

## 2022-08-04 ENCOUNTER — Other Ambulatory Visit: Payer: Self-pay | Admitting: Internal Medicine

## 2022-08-04 ENCOUNTER — Encounter: Payer: Self-pay | Admitting: Internal Medicine

## 2022-08-04 MED ORDER — SEMAGLUTIDE-WEIGHT MANAGEMENT 0.25 MG/0.5ML ~~LOC~~ SOAJ: 0.2500 mg | SUBCUTANEOUS | 0 refills | Status: AC

## 2022-08-04 MED ORDER — SEMAGLUTIDE-WEIGHT MANAGEMENT 0.5 MG/0.5ML ~~LOC~~ SOAJ: 0.5000 mg | SUBCUTANEOUS | 0 refills | Status: AC

## 2022-08-09 DIAGNOSIS — M5431 Sciatica, right side: Secondary | ICD-10-CM | POA: Diagnosis not present

## 2022-08-09 DIAGNOSIS — M9903 Segmental and somatic dysfunction of lumbar region: Secondary | ICD-10-CM | POA: Diagnosis not present

## 2022-08-11 ENCOUNTER — Other Ambulatory Visit: Payer: Self-pay | Admitting: Internal Medicine

## 2022-08-11 MED ORDER — ONDANSETRON HCL 4 MG PO TABS: 4.0000 mg | ORAL_TABLET | Freq: Three times a day (TID) | ORAL | 0 refills | Status: DC | PRN

## 2022-08-18 ENCOUNTER — Telehealth: Payer: Self-pay | Admitting: Internal Medicine

## 2022-08-18 ENCOUNTER — Ambulatory Visit (INDEPENDENT_AMBULATORY_CARE_PROVIDER_SITE_OTHER): Payer: BC Managed Care – PPO

## 2022-08-18 DIAGNOSIS — Z7901 Long term (current) use of anticoagulants: Secondary | ICD-10-CM

## 2022-08-18 LAB — POCT INR: INR: 1.7 — AB (ref 2.0–3.0)

## 2022-08-18 NOTE — Patient Instructions (Signed)
Increase dose today to 3 tablets then continue 2 tablets daily except take 3 tablets on Sundays and Thursdays. Recheck in 4 weeks. 

## 2022-08-18 NOTE — Progress Notes (Signed)
Increase dose today to 3 tablets then continue 2 tablets daily except take 3 tablets on Sundays and Thursdays. Recheck in 4 weeks.

## 2022-08-18 NOTE — Telephone Encounter (Signed)
Called and spoke to pt.  She stated she will change supplement to one without Vitamin K.

## 2022-08-18 NOTE — Telephone Encounter (Signed)
Pt calling back at the request of Barbara Cower after having her coumadin clinic visit today. Pt reports supplement has 135 mcg of Vitamin K. Routed to Antonieta Loveless, RN, per pt request.

## 2022-08-21 DIAGNOSIS — M9903 Segmental and somatic dysfunction of lumbar region: Secondary | ICD-10-CM | POA: Diagnosis not present

## 2022-08-21 DIAGNOSIS — M5431 Sciatica, right side: Secondary | ICD-10-CM | POA: Diagnosis not present

## 2022-08-22 DIAGNOSIS — I83893 Varicose veins of bilateral lower extremities with other complications: Secondary | ICD-10-CM | POA: Diagnosis not present

## 2022-09-04 DIAGNOSIS — M5431 Sciatica, right side: Secondary | ICD-10-CM | POA: Diagnosis not present

## 2022-09-04 DIAGNOSIS — M9903 Segmental and somatic dysfunction of lumbar region: Secondary | ICD-10-CM | POA: Diagnosis not present

## 2022-09-07 NOTE — Telephone Encounter (Signed)
Key: [id number]

## 2022-09-15 ENCOUNTER — Ambulatory Visit: Payer: BC Managed Care – PPO

## 2022-09-15 ENCOUNTER — Ambulatory Visit (INDEPENDENT_AMBULATORY_CARE_PROVIDER_SITE_OTHER): Payer: BC Managed Care – PPO

## 2022-09-15 DIAGNOSIS — Z7901 Long term (current) use of anticoagulants: Secondary | ICD-10-CM

## 2022-09-15 LAB — POCT INR: INR: 3.1 — AB (ref 2.0–3.0)

## 2022-09-15 NOTE — Patient Instructions (Addendum)
Pre visit review using our clinic review tool, if applicable. No additional management support is needed unless otherwise documented below in the visit note.  Decrease dose today to take 1 tablet and the continue 2 tablets daily except take 3 tablets on Sundays and Thursdays. Recheck in 4 weeks.

## 2022-09-15 NOTE — Progress Notes (Signed)
Decrease dose today to take 1 tablet and the continue 2 tablets daily except take 3 tablets on Sundays and Thursdays. Recheck in 4 weeks.

## 2022-09-18 DIAGNOSIS — M5431 Sciatica, right side: Secondary | ICD-10-CM | POA: Diagnosis not present

## 2022-09-18 DIAGNOSIS — M9903 Segmental and somatic dysfunction of lumbar region: Secondary | ICD-10-CM | POA: Diagnosis not present

## 2022-09-20 ENCOUNTER — Encounter: Payer: Self-pay | Admitting: Internal Medicine

## 2022-09-20 DIAGNOSIS — Z01419 Encounter for gynecological examination (general) (routine) without abnormal findings: Secondary | ICD-10-CM | POA: Diagnosis not present

## 2022-09-20 DIAGNOSIS — N920 Excessive and frequent menstruation with regular cycle: Secondary | ICD-10-CM | POA: Diagnosis not present

## 2022-09-21 ENCOUNTER — Other Ambulatory Visit: Payer: Self-pay | Admitting: Internal Medicine

## 2022-09-21 MED ORDER — SEMAGLUTIDE-WEIGHT MANAGEMENT 1.7 MG/0.75ML ~~LOC~~ SOAJ: 1.7000 mg | SUBCUTANEOUS | 0 refills | Status: DC

## 2022-09-21 MED ORDER — SEMAGLUTIDE-WEIGHT MANAGEMENT 2.4 MG/0.75ML ~~LOC~~ SOAJ: 2.4000 mg | SUBCUTANEOUS | 0 refills | Status: DC

## 2022-09-21 MED ORDER — SEMAGLUTIDE-WEIGHT MANAGEMENT 1 MG/0.5ML ~~LOC~~ SOAJ: 1.0000 mg | SUBCUTANEOUS | 0 refills | Status: DC

## 2022-09-21 MED ORDER — SEMAGLUTIDE-WEIGHT MANAGEMENT 0.5 MG/0.5ML ~~LOC~~ SOAJ: 0.5000 mg | SUBCUTANEOUS | 0 refills | Status: DC

## 2022-10-08 ENCOUNTER — Other Ambulatory Visit: Payer: Self-pay | Admitting: Internal Medicine

## 2022-10-08 MED ORDER — ZEPBOUND 2.5 MG/0.5ML ~~LOC~~ SOAJ: 2.5000 mg | SUBCUTANEOUS | 0 refills | Status: DC

## 2022-10-09 DIAGNOSIS — M9903 Segmental and somatic dysfunction of lumbar region: Secondary | ICD-10-CM | POA: Diagnosis not present

## 2022-10-09 DIAGNOSIS — M5431 Sciatica, right side: Secondary | ICD-10-CM | POA: Diagnosis not present

## 2022-10-13 ENCOUNTER — Ambulatory Visit (INDEPENDENT_AMBULATORY_CARE_PROVIDER_SITE_OTHER): Payer: BC Managed Care – PPO

## 2022-10-13 DIAGNOSIS — Z7901 Long term (current) use of anticoagulants: Secondary | ICD-10-CM | POA: Diagnosis not present

## 2022-10-13 LAB — POCT INR: INR: 2.9 (ref 2.0–3.0)

## 2022-10-13 NOTE — Patient Instructions (Addendum)
Pre visit review using our clinic review tool, if applicable. No additional management support is needed unless otherwise documented below in the visit note.  Continue 2 tablets daily except take 3 tablets on Sundays and Thursdays. Recheck in 6 weeks. 

## 2022-10-13 NOTE — Progress Notes (Signed)
Continue 2 tablets daily except take 3 tablets on Sundays and Thursdays. Recheck in 6 weeks. 

## 2022-10-15 ENCOUNTER — Other Ambulatory Visit: Payer: Self-pay | Admitting: Internal Medicine

## 2022-10-15 DIAGNOSIS — D6862 Lupus anticoagulant syndrome: Secondary | ICD-10-CM

## 2022-10-17 DIAGNOSIS — R200 Anesthesia of skin: Secondary | ICD-10-CM | POA: Diagnosis not present

## 2022-10-22 ENCOUNTER — Other Ambulatory Visit: Payer: Self-pay | Admitting: Internal Medicine

## 2022-10-23 DIAGNOSIS — M9903 Segmental and somatic dysfunction of lumbar region: Secondary | ICD-10-CM | POA: Diagnosis not present

## 2022-10-23 DIAGNOSIS — M5431 Sciatica, right side: Secondary | ICD-10-CM | POA: Diagnosis not present

## 2022-10-24 ENCOUNTER — Other Ambulatory Visit (HOSPITAL_COMMUNITY): Payer: Self-pay

## 2022-10-24 NOTE — Telephone Encounter (Signed)
Patient Advocate Encounter   Received notification from [place] that prior authorization for Zepbound is required.   PA submitted on 10/24/2022 Key [id number] Status is pending

## 2022-10-25 ENCOUNTER — Telehealth: Payer: Self-pay

## 2022-10-25 NOTE — Telephone Encounter (Signed)
Patient Advocate Encounter  Prior Authorization for Zepbound 2.5MG /0.5ML pen-injectors has been approved.    PA# [id number] Effective dates: 10/24/22 through 06/24/23

## 2022-11-09 ENCOUNTER — Other Ambulatory Visit: Payer: Self-pay | Admitting: Internal Medicine

## 2022-11-09 DIAGNOSIS — M9903 Segmental and somatic dysfunction of lumbar region: Secondary | ICD-10-CM | POA: Diagnosis not present

## 2022-11-09 DIAGNOSIS — M5431 Sciatica, right side: Secondary | ICD-10-CM | POA: Diagnosis not present

## 2022-11-09 DIAGNOSIS — Z20828 Contact with and (suspected) exposure to other viral communicable diseases: Secondary | ICD-10-CM | POA: Insufficient documentation

## 2022-11-09 MED ORDER — OSELTAMIVIR PHOSPHATE 75 MG PO CAPS: 75.0000 mg | ORAL_CAPSULE | Freq: Every day | ORAL | 0 refills | Status: AC

## 2022-11-21 DIAGNOSIS — R200 Anesthesia of skin: Secondary | ICD-10-CM | POA: Diagnosis not present

## 2022-11-22 MED ORDER — ZEPBOUND 5 MG/0.5ML ~~LOC~~ SOAJ: 5.0000 mg | SUBCUTANEOUS | 0 refills | Status: DC

## 2022-11-22 NOTE — Addendum Note (Signed)
Addended by: Binnie Rail on: 11/22/2022 09:19 PM   Modules accepted: Orders

## 2022-11-24 ENCOUNTER — Ambulatory Visit (INDEPENDENT_AMBULATORY_CARE_PROVIDER_SITE_OTHER): Payer: BC Managed Care – PPO

## 2022-11-24 DIAGNOSIS — Z7901 Long term (current) use of anticoagulants: Secondary | ICD-10-CM | POA: Diagnosis not present

## 2022-11-24 LAB — POCT INR: INR: 2.2 (ref 2.0–3.0)

## 2022-11-24 NOTE — Patient Instructions (Addendum)
Pre visit review using our clinic review tool, if applicable. No additional management support is needed unless otherwise documented below in the visit note.  Continue 2 tablets daily except take 3 tablets on Sundays and Thursdays. Recheck in 6 weeks.

## 2022-11-24 NOTE — Progress Notes (Signed)
Continue 2 tablets daily except take 3 tablets on Sundays and Thursdays. Recheck in 6 weeks.

## 2022-12-04 DIAGNOSIS — M5431 Sciatica, right side: Secondary | ICD-10-CM | POA: Diagnosis not present

## 2022-12-04 DIAGNOSIS — M9903 Segmental and somatic dysfunction of lumbar region: Secondary | ICD-10-CM | POA: Diagnosis not present

## 2022-12-18 DIAGNOSIS — M5431 Sciatica, right side: Secondary | ICD-10-CM | POA: Diagnosis not present

## 2022-12-18 DIAGNOSIS — M064 Inflammatory polyarthropathy: Secondary | ICD-10-CM | POA: Diagnosis not present

## 2022-12-18 DIAGNOSIS — M25511 Pain in right shoulder: Secondary | ICD-10-CM | POA: Diagnosis not present

## 2022-12-18 DIAGNOSIS — M25571 Pain in right ankle and joints of right foot: Secondary | ICD-10-CM | POA: Diagnosis not present

## 2022-12-18 DIAGNOSIS — M9903 Segmental and somatic dysfunction of lumbar region: Secondary | ICD-10-CM | POA: Diagnosis not present

## 2022-12-18 DIAGNOSIS — R760 Raised antibody titer: Secondary | ICD-10-CM | POA: Diagnosis not present

## 2022-12-19 ENCOUNTER — Other Ambulatory Visit: Payer: Self-pay | Admitting: Internal Medicine

## 2022-12-19 MED ORDER — ZEPBOUND 7.5 MG/0.5ML ~~LOC~~ SOAJ: 7.5000 mg | SUBCUTANEOUS | 0 refills | Status: DC

## 2022-12-20 ENCOUNTER — Encounter: Payer: Self-pay | Admitting: Internal Medicine

## 2023-01-02 ENCOUNTER — Other Ambulatory Visit: Payer: Self-pay | Admitting: Internal Medicine

## 2023-01-02 MED ORDER — ZEPBOUND 5 MG/0.5ML ~~LOC~~ SOAJ
5.0000 mg | SUBCUTANEOUS | 0 refills | Status: DC
Start: 2023-01-02 — End: 2023-03-13

## 2023-01-02 NOTE — Telephone Encounter (Signed)
Pt LVM inquiring if Zepbound, 5 mg, has been sent in because she has not heard anything since her msg on 3/29.  Chart shows 5 mg has been sent to pharmacy of choice.   LVM advising medication was sent in today.

## 2023-01-02 NOTE — Telephone Encounter (Signed)
Chris with [facility] calls back inquiring on this. Wanting to know if that, due to shortage on 7.5 mg, it would be alright to get another script of the 5 mg put in.

## 2023-01-05 ENCOUNTER — Ambulatory Visit (INDEPENDENT_AMBULATORY_CARE_PROVIDER_SITE_OTHER): Payer: BC Managed Care – PPO

## 2023-01-05 DIAGNOSIS — Z7901 Long term (current) use of anticoagulants: Secondary | ICD-10-CM

## 2023-01-05 LAB — POCT INR: INR: 2.2 (ref 2.0–3.0)

## 2023-01-05 NOTE — Patient Instructions (Addendum)
Pre visit review using our clinic review tool, if applicable. No additional management support is needed unless otherwise documented below in the visit note.  Continue 2 tablets daily except take 3 tablets on Sundays and Thursdays. Recheck in 6 weeks. 

## 2023-01-05 NOTE — Progress Notes (Signed)
Continue 2 tablets daily except take 3 tablets on Sundays and Thursdays. Recheck in 6 weeks. 

## 2023-01-08 DIAGNOSIS — M5431 Sciatica, right side: Secondary | ICD-10-CM | POA: Diagnosis not present

## 2023-01-08 DIAGNOSIS — M9903 Segmental and somatic dysfunction of lumbar region: Secondary | ICD-10-CM | POA: Diagnosis not present

## 2023-01-29 DIAGNOSIS — M5431 Sciatica, right side: Secondary | ICD-10-CM | POA: Diagnosis not present

## 2023-01-29 DIAGNOSIS — M9903 Segmental and somatic dysfunction of lumbar region: Secondary | ICD-10-CM | POA: Diagnosis not present

## 2023-02-16 ENCOUNTER — Ambulatory Visit (INDEPENDENT_AMBULATORY_CARE_PROVIDER_SITE_OTHER): Payer: BC Managed Care – PPO

## 2023-02-16 DIAGNOSIS — Z7901 Long term (current) use of anticoagulants: Secondary | ICD-10-CM

## 2023-02-16 LAB — POCT INR: INR: 2.1 (ref 2.0–3.0)

## 2023-02-16 NOTE — Progress Notes (Signed)
Continue 2 tablets daily except take 3 tablets on Sundays and Thursdays. Recheck in 6 weeks. 

## 2023-02-16 NOTE — Patient Instructions (Addendum)
Pre visit review using our clinic review tool, if applicable. No additional management support is needed unless otherwise documented below in the visit note.  Continue 2 tablets daily except take 3 tablets on Sundays and Thursdays. Recheck in 6 weeks. 

## 2023-02-20 ENCOUNTER — Other Ambulatory Visit: Payer: Self-pay | Admitting: Internal Medicine

## 2023-02-20 MED ORDER — ZEPBOUND 12.5 MG/0.5ML ~~LOC~~ SOAJ
12.5000 mg | SUBCUTANEOUS | 0 refills | Status: DC
Start: 2023-02-20 — End: 2023-03-13

## 2023-02-20 MED ORDER — ZEPBOUND 10 MG/0.5ML ~~LOC~~ SOAJ
10.0000 mg | SUBCUTANEOUS | 0 refills | Status: DC
Start: 2023-02-20 — End: 2023-03-13

## 2023-02-20 NOTE — Telephone Encounter (Signed)
Patient Comment: I have been on the 7.5 mg of Zepbound and need to move to the 10 mgs, I sent a message to [facility] about contacting you with the refill request.  Can you please send in the Rx for 10 mg and 15 mgs so that I can have the Rx's to fill when needed?  Thank you.   Preferred pharmacy: [facility] - [place], [place] - [address]  Delivery method: Pickup

## 2023-03-01 ENCOUNTER — Telehealth: Payer: Self-pay

## 2023-03-01 NOTE — Telephone Encounter (Signed)
Received PA via Covermymeds; KEY: [id number]. Pt needs updated office visit for BMI/weight, she is scheduled for 03/12/23

## 2023-03-02 ENCOUNTER — Other Ambulatory Visit (HOSPITAL_COMMUNITY): Payer: Self-pay

## 2023-03-02 NOTE — Telephone Encounter (Signed)
Per test claim and CMM, PA is still good, refill is just too soon. Archiving request in [facility]

## 2023-03-12 ENCOUNTER — Ambulatory Visit (INDEPENDENT_AMBULATORY_CARE_PROVIDER_SITE_OTHER): Payer: BC Managed Care – PPO | Admitting: Internal Medicine

## 2023-03-12 ENCOUNTER — Encounter: Payer: Self-pay | Admitting: Internal Medicine

## 2023-03-12 VITALS — BP 136/84 | HR 80 | Temp 98.4°F | Resp 16 | Ht 67.0 in | Wt 234.0 lb

## 2023-03-12 DIAGNOSIS — D6862 Lupus anticoagulant syndrome: Secondary | ICD-10-CM | POA: Diagnosis not present

## 2023-03-12 DIAGNOSIS — M9903 Segmental and somatic dysfunction of lumbar region: Secondary | ICD-10-CM | POA: Diagnosis not present

## 2023-03-12 DIAGNOSIS — D239 Other benign neoplasm of skin, unspecified: Secondary | ICD-10-CM | POA: Diagnosis not present

## 2023-03-12 DIAGNOSIS — M5431 Sciatica, right side: Secondary | ICD-10-CM | POA: Diagnosis not present

## 2023-03-12 DIAGNOSIS — Z6837 Body mass index (BMI) 37.0-37.9, adult: Secondary | ICD-10-CM | POA: Diagnosis not present

## 2023-03-12 DIAGNOSIS — I10 Essential (primary) hypertension: Secondary | ICD-10-CM

## 2023-03-12 DIAGNOSIS — R760 Raised antibody titer: Secondary | ICD-10-CM | POA: Diagnosis not present

## 2023-03-12 DIAGNOSIS — F3281 Premenstrual dysphoric disorder: Secondary | ICD-10-CM

## 2023-03-12 DIAGNOSIS — Z0001 Encounter for general adult medical examination with abnormal findings: Secondary | ICD-10-CM

## 2023-03-12 DIAGNOSIS — Z0000 Encounter for general adult medical examination without abnormal findings: Secondary | ICD-10-CM

## 2023-03-12 LAB — CBC WITH DIFFERENTIAL/PLATELET
Basophils Absolute: 0.1 10*3/uL (ref 0.0–0.1)
Basophils Relative: 1 % (ref 0.0–3.0)
Eosinophils Absolute: 0.1 10*3/uL (ref 0.0–0.7)
Eosinophils Relative: 2.2 % (ref 0.0–5.0)
HCT: 43.9 % (ref 36.0–46.0)
Hemoglobin: 14.3 g/dL (ref 12.0–15.0)
Lymphocytes Relative: 25.4 % (ref 12.0–46.0)
Lymphs Abs: 1.4 10*3/uL (ref 0.7–4.0)
MCHC: 32.7 g/dL (ref 30.0–36.0)
MCV: 90.7 fl (ref 78.0–100.0)
Monocytes Absolute: 0.5 10*3/uL (ref 0.1–1.0)
Monocytes Relative: 9.4 % (ref 3.0–12.0)
Neutro Abs: 3.4 10*3/uL (ref 1.4–7.7)
Neutrophils Relative %: 62 % (ref 43.0–77.0)
Platelets: 246 10*3/uL (ref 150.0–400.0)
RBC: 4.84 Mil/uL (ref 3.87–5.11)
RDW: 14.3 % (ref 11.5–15.5)
WBC: 5.5 10*3/uL (ref 4.0–10.5)

## 2023-03-12 LAB — BASIC METABOLIC PANEL
BUN: 12 mg/dL (ref 6–23)
CO2: 25 mEq/L (ref 19–32)
Calcium: 9 mg/dL (ref 8.4–10.5)
Chloride: 104 mEq/L (ref 96–112)
Creatinine, Ser: 0.72 mg/dL (ref 0.40–1.20)
GFR: 100.89 mL/min (ref >60.00)
Glucose, Bld: 81 mg/dL (ref 70–99)
Potassium: 4.1 mEq/L (ref 3.5–5.1)
Sodium: 138 mEq/L (ref 135–145)

## 2023-03-12 LAB — URINALYSIS, ROUTINE W REFLEX MICROSCOPIC
Bilirubin Urine: NEGATIVE
Hgb urine dipstick: NEGATIVE
Ketones, ur: NEGATIVE
Leukocytes,Ua: NEGATIVE
Nitrite: NEGATIVE
RBC / HPF: NONE SEEN (ref 0–?)
Specific Gravity, Urine: >=1.03 — AB (ref 1.000–1.030)
Total Protein, Urine: NEGATIVE
Urine Glucose: NEGATIVE
Urobilinogen, UA: 0.2 (ref 0.0–1.0)
pH: 6 (ref 5.0–8.0)

## 2023-03-12 LAB — LIPID PANEL
Cholesterol: 118 mg/dL (ref 0–200)
HDL: 69.9 mg/dL (ref >39.00)
LDL Cholesterol: 40 mg/dL (ref 0–99)
NonHDL: 47.9
Total CHOL/HDL Ratio: 2
Triglycerides: 41 mg/dL (ref 0.0–149.0)
VLDL: 8.2 mg/dL (ref 0.0–40.0)

## 2023-03-12 LAB — HEPATIC FUNCTION PANEL
ALT: 16 U/L (ref 0–35)
AST: 20 U/L (ref 0–37)
Albumin: 4.3 g/dL (ref 3.5–5.2)
Alkaline Phosphatase: 32 U/L — ABNORMAL LOW (ref 39–117)
Bilirubin, Direct: 0.1 mg/dL (ref 0.0–0.3)
Total Bilirubin: 0.5 mg/dL (ref 0.2–1.2)
Total Protein: 7 g/dL (ref 6.0–8.3)

## 2023-03-12 LAB — TSH: TSH: 2.09 u[IU]/mL (ref 0.35–5.50)

## 2023-03-12 NOTE — Progress Notes (Unsigned)
Subjective:  Patient ID: Zoe Miles, female    DOB: [birth year]  Age: 47 y.o. MRN: [id number]  CC: Annual Exam   HPI Zoe Miles presents for a CPX and f/up ---  She is active and denies chest pain, shortness of breath, diaphoresis, or edema.  Outpatient Medications Prior to Visit  Medication Sig Dispense Refill   cycloSPORINE (RESTASIS) 0.05 % ophthalmic emulsion Place 1 drop into both eyes 2 (two) times daily.     hydroxychloroquine (PLAQUENIL) 200 MG tablet Take 200 mg by mouth 2 (two) times daily.      norethindrone (MICRONOR,CAMILA,ERRIN) 0.35 MG tablet Take 1 tablet by mouth daily.     warfarin (COUMADIN) 5 MG tablet TAKE 2 TABLETS DAILY BY MOUTH EXCEPT TAKE 3 TABLETS ON SUNDAYS AND THURSDAYS OR AS DIRECTED BY ANTICOAGULATION CLINIC 208 tablet 2   DULoxetine (CYMBALTA) 60 MG capsule TAKE 1 CAPSULE BY MOUTH EVERY DAY 90 capsule 1   Omega-3 Fatty Acids (FISH OIL PO) Take 1 capsule by mouth daily.      ondansetron (ZOFRAN) 4 MG tablet Take 1 tablet (4 mg total) by mouth every 8 (eight) hours as needed for nausea or vomiting. 30 tablet 0   tirzepatide (ZEPBOUND) 10 MG/0.5ML Pen Inject 10 mg into the skin once a week. 6 mL 0   tirzepatide (ZEPBOUND) 12.5 MG/0.5ML Pen Inject 12.5 mg into the skin once a week. 6 mL 0   tirzepatide (ZEPBOUND) 5 MG/0.5ML Pen Inject 5 mg into the skin once a week. 6 mL 0   No facility-administered medications prior to visit.    ROS Review of Systems  Constitutional: Negative.  Negative for appetite change, diaphoresis, fatigue and unexpected weight change.  HENT: Negative.  Negative for nosebleeds.   Eyes: Negative.   Respiratory:  Negative for cough, chest tightness, shortness of breath and wheezing.   Cardiovascular:  Negative for chest pain, palpitations and leg swelling.  Gastrointestinal:  Negative for abdominal pain, anal bleeding, blood in stool, constipation, diarrhea, nausea and vomiting.  Endocrine: Negative.    Genitourinary:  Positive for hematuria. Negative for difficulty urinating.  Musculoskeletal: Negative.  Negative for myalgias.  Skin: Negative.   Neurological: Negative.  Negative for dizziness and weakness.  Hematological:  Negative for adenopathy. Does not bruise/bleed easily.  Psychiatric/Behavioral: Negative.      Objective:  BP 136/84 (BP Location: Left Arm, Patient Position: Sitting, Cuff Size: Large)   Pulse 80   Temp 98.4 F (36.9 C) (Oral)   Resp 16   Ht 5\' 7"  (1.702 m)   Wt 234 lb (106.1 kg)   LMP 02/05/2023 (Exact Date)   SpO2 99%   BMI 36.65 kg/m   BP Readings from Last 3 Encounters:  03/12/23 136/84  03/08/22 116/78  07/13/20 126/86    Wt Readings from Last 3 Encounters:  03/12/23 234 lb (106.1 kg)  03/08/22 239 lb (108.4 kg)  07/13/20 220 lb (99.8 kg)    Physical Exam Vitals reviewed.  Constitutional:      Appearance: Normal appearance.  HENT:     Mouth/Throat:     Mouth: Mucous membranes are moist.  Eyes:     General: No scleral icterus.    Conjunctiva/sclera: Conjunctivae normal.  Cardiovascular:     Rate and Rhythm: Normal rate and regular rhythm.     Heart sounds: Normal heart sounds, S1 normal and S2 normal.     No friction rub. No gallop.     Comments: EKG- NSR, 81 bpm Low  voltage Isolated low voltage in III No LVH or Q waves  Pulmonary:     Effort: Pulmonary effort is normal.     Breath sounds: No stridor. No wheezing, rhonchi or rales.  Abdominal:     General: Abdomen is flat.     Palpations: There is no mass.     Tenderness: There is no abdominal tenderness. There is no guarding.     Hernia: No hernia is present.  Musculoskeletal:     Cervical back: Neck supple.     Right lower leg: No edema.     Left lower leg: No edema.  Lymphadenopathy:     Cervical: No cervical adenopathy.  Skin:    General: Skin is warm and dry.  Neurological:     General: No focal deficit present.     Mental Status: She is alert. Mental status is at  baseline.  Psychiatric:        Mood and Affect: Mood normal.        Behavior: Behavior normal.     Lab Results  Component Value Date   WBC 5.5 03/12/2023   HGB 14.3 03/12/2023   HCT 43.9 03/12/2023   PLT 246.0 03/12/2023   GLUCOSE 81 03/12/2023   CHOL 118 03/12/2023   TRIG 41.0 03/12/2023   HDL 69.90 03/12/2023   LDLCALC 40 03/12/2023   ALT 16 03/12/2023   AST 20 03/12/2023   NA 138 03/12/2023   K 4.1 03/12/2023   CL 104 03/12/2023   CREATININE 0.72 03/12/2023   BUN 12 03/12/2023   CO2 25 03/12/2023   TSH 2.09 03/12/2023   INR 2.1 02/16/2023   HGBA1C 5.5 03/08/2022    MM 3D SCREEN BREAST BILATERAL  Result Date: 05/26/2022 CLINICAL DATA:  Screening. EXAM: DIGITAL SCREENING BILATERAL MAMMOGRAM WITH TOMOSYNTHESIS AND CAD TECHNIQUE: Bilateral screening digital craniocaudal and mediolateral oblique mammograms were obtained. Bilateral screening digital breast tomosynthesis was performed. The images were evaluated with computer-aided detection. COMPARISON:  Previous exam(s). ACR Breast Density Category b: There are scattered areas of fibroglandular density. FINDINGS: There are no findings suspicious for malignancy. IMPRESSION: No mammographic evidence of malignancy. A result letter of this screening mammogram will be mailed directly to the patient. RECOMMENDATION: Screening mammogram in one year. (Code:SM-B-01Y) BI-RADS CATEGORY  1: Negative. Electronically Signed   By: Frederico Hamman M.D.   On: 05/26/2022 14:00    Assessment & Plan:   Anti-cardiolipin antibody positive- Lupus anticoagulant remains positive.  Will continue Coumadin. -     Lupus anticoagulant panel; Future  Lupus anticoagulant with hypercoagulable state (HCC) -     Lupus anticoagulant panel; Future  Primary - EKG is negative for LVH.  Labs are negative for secondary causes and endorgan damage.  She would prefer not to start an antihypertensive but instead will continue working on her lifestyle modifications. -      EKG 12-Lead -     Basic metabolic panel; Future -     CBC with Differential/Platelet; Future -     TSH; Future -     Urinalysis, Routine w reflex microscopic; Future -     Hepatic function panel; Future  Class 2 severe obesity due to excess calories with serious comorbidity and body mass index (BMI) of 37.0 to 37.9 in adult [facility]) -     Basic metabolic panel; Future -     TSH; Future -     Hepatic function panel; Future -     Zepbound; Inject 15 mg into the skin  once a week.  Dispense: 6 mL; Refill: 1  Encounter for general adult medical examination with abnormal findings- Exam completed, labs reviewed, vaccines reviewed and updated, cancer screenings are up-to-date, patient education was given. -     Lipid panel; Future  Multiple dysplastic nevi -     Ambulatory referral to Dermatology  PMDD (premenstrual dysphoric disorder) -     DULoxetine HCl; Take 1 capsule (60 mg total) by mouth daily.  Dispense: 90 capsule; Refill: 1  Other orders -     PTT-LA Mix -     Hexagonal Phase Phospholipid -     dRVVT Mix -     dRVVT Confirm     Follow-up: Return in about 6 months (around 09/11/2023).  Sanda Linger, MD

## 2023-03-12 NOTE — Patient Instructions (Signed)

## 2023-03-13 ENCOUNTER — Other Ambulatory Visit: Payer: Self-pay | Admitting: Internal Medicine

## 2023-03-13 LAB — LUPUS ANTICOAGULANT PANEL
Dilute Viper Venom Time: 93 s — ABNORMAL HIGH (ref 0.0–47.0)
PTT Lupus Anticoagulant: 66.2 s — ABNORMAL HIGH (ref 0.0–43.5)

## 2023-03-13 LAB — DRVVT MIX: dRVVT Mix: 45.3 s — ABNORMAL HIGH (ref 0.0–40.4)

## 2023-03-13 LAB — HEXAGONAL PHASE PHOSPHOLIPID: Hexagonal Phase Phospholipid: 4 s (ref 0–11)

## 2023-03-13 LAB — DRVVT CONFIRM: dRVVT Confirm: 1.8 ratio — ABNORMAL HIGH (ref 0.8–1.2)

## 2023-03-13 LAB — PTT-LA MIX: PTT-LA Mix: 50.5 s — ABNORMAL HIGH (ref 0.0–40.5)

## 2023-03-13 MED ORDER — ZEPBOUND 15 MG/0.5ML ~~LOC~~ SOAJ
15.0000 mg | SUBCUTANEOUS | 1 refills | Status: DC
Start: 2023-03-13 — End: 2023-10-09

## 2023-03-13 MED ORDER — DULOXETINE HCL 60 MG PO CPEP: 60.0000 mg | ORAL_CAPSULE | Freq: Every day | ORAL | 1 refills | Status: DC

## 2023-03-15 ENCOUNTER — Encounter: Payer: Self-pay | Admitting: Internal Medicine

## 2023-03-27 ENCOUNTER — Ambulatory Visit (INDEPENDENT_AMBULATORY_CARE_PROVIDER_SITE_OTHER): Payer: BC Managed Care – PPO

## 2023-03-27 DIAGNOSIS — Z7901 Long term (current) use of anticoagulants: Secondary | ICD-10-CM | POA: Diagnosis not present

## 2023-03-27 LAB — POCT INR: INR: 2.4 (ref 2.0–3.0)

## 2023-03-27 NOTE — Progress Notes (Signed)
Continue 2 tablets daily except take 3 tablets on Sundays and Thursdays. Recheck in 6 weeks. 

## 2023-03-27 NOTE — Patient Instructions (Addendum)
Pre visit review using our clinic review tool, if applicable. No additional management support is needed unless otherwise documented below in the visit note.  Continue 2 tablets daily except take 3 tablets on Sundays and Thursdays. Recheck in 6 weeks. 

## 2023-04-09 DIAGNOSIS — M9903 Segmental and somatic dysfunction of lumbar region: Secondary | ICD-10-CM | POA: Diagnosis not present

## 2023-04-09 DIAGNOSIS — M5431 Sciatica, right side: Secondary | ICD-10-CM | POA: Diagnosis not present

## 2023-04-11 ENCOUNTER — Other Ambulatory Visit: Payer: Self-pay | Admitting: Internal Medicine

## 2023-04-11 DIAGNOSIS — Z1231 Encounter for screening mammogram for malignant neoplasm of breast: Secondary | ICD-10-CM

## 2023-04-23 ENCOUNTER — Encounter: Payer: Self-pay | Admitting: Gastroenterology

## 2023-04-23 DIAGNOSIS — M9903 Segmental and somatic dysfunction of lumbar region: Secondary | ICD-10-CM | POA: Diagnosis not present

## 2023-04-23 DIAGNOSIS — M5431 Sciatica, right side: Secondary | ICD-10-CM | POA: Diagnosis not present

## 2023-04-25 DIAGNOSIS — R300 Dysuria: Secondary | ICD-10-CM | POA: Diagnosis not present

## 2023-04-25 DIAGNOSIS — Z6836 Body mass index (BMI) 36.0-36.9, adult: Secondary | ICD-10-CM | POA: Diagnosis not present

## 2023-05-07 ENCOUNTER — Telehealth: Payer: Self-pay

## 2023-05-07 NOTE — Telephone Encounter (Signed)
Pt reports she had a UTI treated at an UC over a week ago and was prescribed Augmentin x 10 days. Finished the abx on 8/3. She reports she is still having some symptoms and would like an apt with PCP around time she has coumadin clinic apt tomorrow.   Contacted front office manager at [facility] and seh was able to get pt apt with PCP after coumadin clinic apt.   Advised pt she has apt with PCP tomorrow at 9:20. Pt verbalized understanding and was appreciative of the help.

## 2023-05-08 ENCOUNTER — Ambulatory Visit (INDEPENDENT_AMBULATORY_CARE_PROVIDER_SITE_OTHER): Payer: BC Managed Care – PPO

## 2023-05-08 ENCOUNTER — Encounter: Payer: Self-pay | Admitting: Internal Medicine

## 2023-05-08 ENCOUNTER — Ambulatory Visit: Payer: BC Managed Care – PPO | Admitting: Internal Medicine

## 2023-05-08 VITALS — BP 124/76 | HR 87 | Temp 98.5°F | Resp 16 | Ht 67.0 in | Wt 230.0 lb

## 2023-05-08 DIAGNOSIS — B3731 Acute candidiasis of vulva and vagina: Secondary | ICD-10-CM

## 2023-05-08 DIAGNOSIS — Z7901 Long term (current) use of anticoagulants: Secondary | ICD-10-CM

## 2023-05-08 DIAGNOSIS — N3000 Acute cystitis without hematuria: Secondary | ICD-10-CM | POA: Diagnosis not present

## 2023-05-08 LAB — URINALYSIS, ROUTINE W REFLEX MICROSCOPIC
Bilirubin Urine: NEGATIVE
Hgb urine dipstick: NEGATIVE
Ketones, ur: NEGATIVE
Leukocytes,Ua: NEGATIVE
Nitrite: NEGATIVE
RBC / HPF: NONE SEEN (ref 0–?)
Specific Gravity, Urine: 1.01 (ref 1.000–1.030)
Total Protein, Urine: NEGATIVE
Urine Glucose: NEGATIVE
Urobilinogen, UA: 0.2 (ref 0.0–1.0)
pH: 6 (ref 5.0–8.0)

## 2023-05-08 LAB — POCT INR: INR: 3 (ref 2.0–3.0)

## 2023-05-08 MED ORDER — FLUCONAZOLE 150 MG PO TABS
150.0000 mg | ORAL_TABLET | Freq: Every day | ORAL | 3 refills | Status: DC
Start: 2023-05-08 — End: 2024-03-31

## 2023-05-08 NOTE — Progress Notes (Signed)
Pt reports she had a UTI treated at an UC over a week ago and was prescribed Augmentin x 10 days. Finished the abx on 8/3. She reports she is still having some symptoms and has an apt with PCP today.  Continue 2 tablets daily except take 3 tablets on Sundays and Thursdays. Recheck in 6 weeks.

## 2023-05-08 NOTE — Progress Notes (Signed)
Subjective:  Patient ID: Zoe Miles, female    DOB: 09/14/1976  Age: 47 y.o. MRN: [id number]  CC: Urinary Tract Infection   HPI Arlene Slutsky presents for f/up -----  Discussed the use of AI scribe software for clinical note transcription with the patient, who gave verbal consent to proceed.  History of Present Illness   The patient, with a history of urinary tract infection, presented with discomfort and increased awareness of the urinary region. The symptoms began [place], and were characterized by a sensation of constant pressure, but not severe burning or extreme urgency as experienced with previous UTIs. The patient reported no hematuria.  Approximately two weeks prior to this consultation, the patient sought care at an urgent care center where they were diagnosed with a UTI caused by Klebsiella bacteria. They were prescribed a seven-day course of Augmentin, which was later extended by three days due to persistent symptoms. The patient reported some improvement with the antibiotic, but also experienced diarrhea, which they attributed to the medication. They also noted an increase in their INR levels during the antibiotic course.  In addition to the urinary symptoms, they also reported scant vaginal discharge after their recent menstrual period, raising concerns of a possible yeast infection secondary to the antibiotic treatment.       Outpatient Medications Prior to Visit  Medication Sig Dispense Refill   cycloSPORINE (RESTASIS) 0.05 % ophthalmic emulsion Place 1 drop into both eyes 2 (two) times daily.     DULoxetine (CYMBALTA) 60 MG capsule Take 1 capsule (60 mg total) by mouth daily. 90 capsule 1   hydroxychloroquine (PLAQUENIL) 200 MG tablet Take 200 mg by mouth 2 (two) times daily.      norethindrone (MICRONOR,CAMILA,ERRIN) 0.35 MG tablet Take 1 tablet by mouth daily.     tirzepatide (ZEPBOUND) 15 MG/0.5ML Pen Inject 15 mg into the skin  once a week. 6 mL 1   warfarin (COUMADIN) 5 MG tablet TAKE 2 TABLETS DAILY BY MOUTH EXCEPT TAKE 3 TABLETS ON SUNDAYS AND THURSDAYS OR AS DIRECTED BY ANTICOAGULATION CLINIC 208 tablet 2   No facility-administered medications prior to visit.    ROS Review of Systems  Constitutional: Negative.  Negative for appetite change, chills, diaphoresis, fatigue and fever.  HENT: Negative.    Eyes: Negative.  Negative for discharge.  Respiratory: Negative.  Negative for chest tightness and shortness of breath.   Cardiovascular:  Negative for chest pain, palpitations and leg swelling.  Gastrointestinal:  Negative for abdominal pain, blood in stool, diarrhea and vomiting.  Endocrine: Negative.   Genitourinary:  Positive for dysuria and vaginal discharge. Negative for difficulty urinating, flank pain, frequency, hematuria, pelvic pain, vaginal bleeding and vaginal pain.  Musculoskeletal: Negative.   Skin: Negative.  Negative for color change and rash.  Neurological: Negative.  Negative for dizziness, weakness and light-headedness.  Hematological:  Negative for adenopathy. Does not bruise/bleed easily.  Psychiatric/Behavioral: Negative.      Objective:  BP 124/76 (BP Location: Right Arm, Patient Position: Sitting, Cuff Size: Large)   Pulse 87   Temp 98.5 F (36.9 C) (Oral)   Resp 16   Ht 5\' 7"  (1.702 m)   Wt 230 lb (104.3 kg)   LMP 05/02/2023 (Approximate)   SpO2 98%   BMI 36.02 kg/m   BP Readings from Last 3 Encounters:  05/08/23 124/76  03/12/23 136/84  03/08/22 116/78    Wt Readings from Last 3 Encounters:  05/08/23 230 lb (104.3  kg)  03/12/23 234 lb (106.1 kg)  03/08/22 239 lb (108.4 kg)    Physical Exam Vitals reviewed.  Constitutional:      Appearance: Normal appearance.  HENT:     Nose: Nose normal.     Mouth/Throat:     Mouth: Mucous membranes are moist.  Eyes:     General: No scleral icterus.    Conjunctiva/sclera: Conjunctivae normal.  Cardiovascular:     Rate and  Rhythm: Normal rate and regular rhythm.     Heart sounds: No murmur heard. Pulmonary:     Effort: Pulmonary effort is normal.     Breath sounds: No stridor. No wheezing, rhonchi or rales.  Abdominal:     General: Abdomen is protuberant. Bowel sounds are normal. There is no distension.     Palpations: There is no mass.     Tenderness: There is no abdominal tenderness. There is no guarding.     Hernia: No hernia is present.  Musculoskeletal:        General: Normal range of motion.     Cervical back: Neck supple.     Right lower leg: No edema.     Left lower leg: No edema.  Lymphadenopathy:     Cervical: No cervical adenopathy.  Skin:    General: Skin is warm and dry.     Coloration: Skin is not pale.     Findings: No rash.  Neurological:     General: No focal deficit present.     Mental Status: She is alert. Mental status is at baseline.  Psychiatric:        Mood and Affect: Mood normal.        Behavior: Behavior normal.     Lab Results  Component Value Date   WBC 5.5 03/12/2023   HGB 14.3 03/12/2023   HCT 43.9 03/12/2023   PLT 246.0 03/12/2023   GLUCOSE 81 03/12/2023   CHOL 118 03/12/2023   TRIG 41.0 03/12/2023   HDL 69.90 03/12/2023   LDLCALC 40 03/12/2023   ALT 16 03/12/2023   AST 20 03/12/2023   NA 138 03/12/2023   K 4.1 03/12/2023   CL 104 03/12/2023   CREATININE 0.72 03/12/2023   BUN 12 03/12/2023   CO2 25 03/12/2023   TSH 2.09 03/12/2023   INR 3.0 05/08/2023   HGBA1C 5.5 03/08/2022    MM 3D SCREEN BREAST BILATERAL  Result Date: 05/26/2022 CLINICAL DATA:  Screening. EXAM: DIGITAL SCREENING BILATERAL MAMMOGRAM WITH TOMOSYNTHESIS AND CAD TECHNIQUE: Bilateral screening digital craniocaudal and mediolateral oblique mammograms were obtained. Bilateral screening digital breast tomosynthesis was performed. The images were evaluated with computer-aided detection. COMPARISON:  Previous exam(s). ACR Breast Density Category b: There are scattered areas of  fibroglandular density. FINDINGS: There are no findings suspicious for malignancy. IMPRESSION: No mammographic evidence of malignancy. A result letter of this screening mammogram will be mailed directly to the patient. RECOMMENDATION: Screening mammogram in one year. (Code:SM-B-01Y) BI-RADS CATEGORY  1: Negative. Electronically Signed   By: Frederico Hamman M.D.   On: 05/26/2022 14:00    Assessment & Plan:   Acute cystitis without hematuria--- UA and culture are negative. -     Urinalysis, Routine w reflex microscopic; Future -     CULTURE, URINE COMPREHENSIVE; Future  Yeast vaginitis -     Fluconazole; Take 1 tablet (150 mg total) by mouth daily.  Dispense: 1 tablet; Refill: 3     Follow-up: No follow-ups on file.  Sanda Linger, MD

## 2023-05-08 NOTE — Patient Instructions (Addendum)
Pre visit review using our clinic review tool, if applicable. No additional management support is needed unless otherwise documented below in the visit note.  Continue 2 tablets daily except take 3 tablets on Sundays and Thursdays. Recheck in 6 weeks.

## 2023-05-17 ENCOUNTER — Encounter (INDEPENDENT_AMBULATORY_CARE_PROVIDER_SITE_OTHER): Payer: Self-pay

## 2023-05-21 DIAGNOSIS — M5431 Sciatica, right side: Secondary | ICD-10-CM | POA: Diagnosis not present

## 2023-05-21 DIAGNOSIS — M9903 Segmental and somatic dysfunction of lumbar region: Secondary | ICD-10-CM | POA: Diagnosis not present

## 2023-05-31 ENCOUNTER — Ambulatory Visit
Admission: RE | Admit: 2023-05-31 | Discharge: 2023-05-31 | Disposition: A | Discharge status: Home / Self Care | Payer: BC Managed Care – PPO | Source: Ambulatory Visit | Attending: Internal Medicine | Admitting: Internal Medicine

## 2023-05-31 DIAGNOSIS — Z1231 Encounter for screening mammogram for malignant neoplasm of breast: Secondary | ICD-10-CM | POA: Diagnosis not present

## 2023-06-05 ENCOUNTER — Other Ambulatory Visit: Payer: Self-pay | Admitting: Internal Medicine

## 2023-06-05 DIAGNOSIS — R928 Other abnormal and inconclusive findings on diagnostic imaging of breast: Secondary | ICD-10-CM

## 2023-06-09 ENCOUNTER — Other Ambulatory Visit: Payer: Self-pay | Admitting: Internal Medicine

## 2023-06-09 DIAGNOSIS — D6862 Lupus anticoagulant syndrome: Secondary | ICD-10-CM

## 2023-06-11 DIAGNOSIS — M9903 Segmental and somatic dysfunction of lumbar region: Secondary | ICD-10-CM | POA: Diagnosis not present

## 2023-06-11 DIAGNOSIS — M5431 Sciatica, right side: Secondary | ICD-10-CM | POA: Diagnosis not present

## 2023-06-18 ENCOUNTER — Ambulatory Visit
Admission: RE | Admit: 2023-06-18 | Discharge: 2023-06-18 | Disposition: A | Discharge status: Home / Self Care | Payer: BC Managed Care – PPO | Source: Ambulatory Visit | Attending: Internal Medicine | Admitting: Internal Medicine

## 2023-06-18 ENCOUNTER — Other Ambulatory Visit: Payer: Self-pay | Admitting: Internal Medicine

## 2023-06-18 DIAGNOSIS — R928 Other abnormal and inconclusive findings on diagnostic imaging of breast: Secondary | ICD-10-CM | POA: Diagnosis not present

## 2023-06-18 DIAGNOSIS — N6489 Other specified disorders of breast: Secondary | ICD-10-CM

## 2023-06-19 ENCOUNTER — Ambulatory Visit (INDEPENDENT_AMBULATORY_CARE_PROVIDER_SITE_OTHER): Payer: BC Managed Care – PPO

## 2023-06-19 DIAGNOSIS — Z7901 Long term (current) use of anticoagulants: Secondary | ICD-10-CM | POA: Diagnosis not present

## 2023-06-19 LAB — POCT INR: INR: 2.4 (ref 2.0–3.0)

## 2023-06-19 NOTE — Progress Notes (Signed)
Continue 2 tablets daily except take 3 tablets on Sundays and Thursdays. Recheck in 6 weeks.

## 2023-06-19 NOTE — Patient Instructions (Addendum)
Pre visit review using our clinic review tool, if applicable. No additional management support is needed unless otherwise documented below in the visit note.  Continue 2 tablets daily except take 3 tablets on Sundays and Thursdays. Recheck in 6 weeks.

## 2023-06-25 ENCOUNTER — Ambulatory Visit (INDEPENDENT_AMBULATORY_CARE_PROVIDER_SITE_OTHER): Payer: BC Managed Care – PPO | Admitting: Dermatology

## 2023-06-25 ENCOUNTER — Encounter: Payer: Self-pay | Admitting: Dermatology

## 2023-06-25 VITALS — BP 129/77 | HR 81

## 2023-06-25 DIAGNOSIS — L814 Other melanin hyperpigmentation: Secondary | ICD-10-CM

## 2023-06-25 DIAGNOSIS — D2339 Other benign neoplasm of skin of other parts of face: Secondary | ICD-10-CM | POA: Diagnosis not present

## 2023-06-25 DIAGNOSIS — Z1283 Encounter for screening for malignant neoplasm of skin: Secondary | ICD-10-CM

## 2023-06-25 DIAGNOSIS — W908XXA Exposure to other nonionizing radiation, initial encounter: Secondary | ICD-10-CM

## 2023-06-25 DIAGNOSIS — D239 Other benign neoplasm of skin, unspecified: Secondary | ICD-10-CM

## 2023-06-25 DIAGNOSIS — L578 Other skin changes due to chronic exposure to nonionizing radiation: Secondary | ICD-10-CM

## 2023-06-25 DIAGNOSIS — D1801 Hemangioma of skin and subcutaneous tissue: Secondary | ICD-10-CM

## 2023-06-25 DIAGNOSIS — D229 Melanocytic nevi, unspecified: Secondary | ICD-10-CM

## 2023-06-25 DIAGNOSIS — L821 Other seborrheic keratosis: Secondary | ICD-10-CM

## 2023-06-25 NOTE — Progress Notes (Signed)
   New Patient Visit   Subjective  Zoe Miles is a 47 y.o. female who presents for the following: Skin Cancer Screening and Full Body Skin Exam  The patient presents for Total-Body Skin Exam (TBSE) for skin cancer screening and mole check. The patient has spots, moles and lesions to be evaluated, some may be new or changing and the patient may have concern these could be cancer. Pt's last skin check was around 2019. Pt has no hx of skin cancer nor family hx. She has had a few moles removed in the past which were benign.  Pt works for [employer]  The following portions of the chart were reviewed this encounter and updated as appropriate: medications, allergies, medical history  Review of Systems:  No other skin or systemic complaints except as noted in HPI or Assessment and Plan.  Objective  Well appearing patient in no apparent distress; mood and affect are within normal limits.  A full examination was performed including scalp, head, eyes, ears, nose, lips, neck, chest, axillae, abdomen, back, buttocks, bilateral upper extremities, bilateral lower extremities, hands, feet, fingers, toes, fingernails, and toenails. All findings within normal limits unless otherwise noted below.   Relevant physical exam findings are noted in the Assessment and Plan.   Assessment & Plan   SKIN CANCER SCREENING PERFORMED TODAY.  ACTINIC DAMAGE - Chronic condition, secondary to cumulative UV/sun exposure - diffuse scaly erythematous macules with underlying dyspigmentation - Recommend daily broad spectrum sunscreen SPF 30+ to sun-exposed areas, reapply every 2 hours as needed.  - Staying in the shade or wearing long sleeves, sun glasses (UVA+UVB protection) and wide brim hats (4-inch brim around the entire circumference of the hat) are also recommended for sun protection.  - Call for new or changing lesions.  MELANOCYTIC NEVI - Tan-brown and/or pink-flesh-colored symmetric macules and papules -  Benign appearing on exam today - Observation - Call clinic for new or changing moles - Recommend daily use of broad spectrum spf 30+ sunscreen to sun-exposed areas.   SEBORRHEIC KERATOSIS - Stuck-on, waxy, tan-brown papules and/or plaques  - Benign-appearing - Discussed benign etiology and prognosis. - Observe - Call for any changes  LENTIGINES Exam: scattered tan macules Due to sun exposure Treatment Plan: Benign-appearing, observe. Recommend daily broad spectrum sunscreen SPF 30+ to sun-exposed areas, reapply every 2 hours as needed.  Call for any changes    HEMANGIOMA Exam: red papule(s) Discussed benign nature. Recommend observation. Call for changes.   Syringomas- Periorbital- Bilateral Exam: flesh colored nodules  Treatment Plan: Discuss with pt that ablative laser or deep chemical peel could be considered to remove these spots Consider [facility] in [facility] Reassured on benign nature   Return for TBSE.  I, Tillie Fantasia, CMA, am acting as scribe for Gwenith Daily, MD.   Documentation: I have reviewed the above documentation for accuracy and completeness, and I agree with the above.  Gwenith Daily, MD

## 2023-06-25 NOTE — Patient Instructions (Addendum)
[facility] (Dr. Sedalia Muta) for syringomas under the eyes [phone]    Skin Education :  We counseled the patient regarding the following: Sun screen (SPF 30 or greater) should be applied during peak UV exposure (between 10am and 2pm) and reapplied after exercise or swimming.  The ABCDEs of melanoma were reviewed with the patient, and the importance of monthly self-examination of moles was emphasized. Should any moles change in shape or color, or itch, bleed or burn, pt will contact our office for evaluation sooner then their interval appointment.  Plan: Sunscreen Recommendations We recommended a broad spectrum sunscreen with a SPF of 30 or higher. SPF 30 sunscreens block approximately 97 percent of the sun's harmful rays. Sunscreens should be applied at least 15 minutes prior to expected sun exposure and then every 2 hours after that as long as sun exposure continues. If swimming or exercising sunscreen should be reapplied every 45 minutes to an hour after getting wet or sweating. One ounce, or the equivalent of a shot glass full of sunscreen, is adequate to protect the skin not covered by a bathing suit. We also recommended a lip balm with a sunscreen as well. Sun protective clothing can be used in lieu of sunscreen but must be worn the entire time you are exposed to the sun's rays. Important Information   Due to recent changes in healthcare laws, you may see results of your pathology and/or laboratory studies on MyChart before the doctors have had a chance to review them. We understand that in some cases there may be results that are confusing or concerning to you. Please understand that not all results are received at the same time and often the doctors may need to interpret multiple results in order to provide you with the best plan of care or course of treatment. Therefore, we ask that you please give [place] 2 business days to thoroughly review all your results before contacting the office for  clarification. Should we see a critical lab result, you will be contacted sooner.     If You Need Anything After Your Visit   If you have any questions or concerns for your doctor, please call our main line at [phone]. If no one answers, please leave a voicemail as directed and we will return your call as soon as possible. Messages left after 4 pm will be answered the following business day.    You may also send [place] a message via MyChart. We typically respond to MyChart messages within 1-2 business days.  For prescription refills, please ask your pharmacy to contact our office. Our fax number is [phone].  If you have an urgent issue when the clinic is closed that cannot wait until the next business day, you can page your doctor at the number below.     Please note that while we do our best to be available for urgent issues outside of office hours, we are not available 24/7.    If you have an urgent issue and are unable to reach [place], you may choose to seek medical care at your doctor's office, retail clinic, urgent care center, or emergency room.   If you have a medical emergency, please immediately call 911 or go to the emergency department. In the event of inclement weather, please call our main line at [phone] for an update on the status of any delays or closures.  Dermatology Medication Tips: Please keep the boxes that topical medications come in in order to help keep track  of the instructions about where and how to use these. Pharmacies typically print the medication instructions only on the boxes and not directly on the medication tubes.   If your medication is too expensive, please contact our office at [phone] or send [place] a message through MyChart.    We are unable to tell what your co-pay for medications will be in advance as this is different depending on your insurance coverage. However, we may be able to find a substitute medication at lower cost or fill out  paperwork to get insurance to cover a needed medication.    If a prior authorization is required to get your medication covered by your insurance company, please allow [place] 1-2 business days to complete this process.   Drug prices often vary depending on where the prescription is filled and some pharmacies may offer cheaper prices.   The website www.goodrx.com contains coupons for medications through different pharmacies. The prices here do not account for what the cost may be with help from insurance (it may be cheaper with your insurance), but the website can give you the price if you did not use any insurance.  - You can print the associated coupon and take it with your prescription to the pharmacy.  - You may also stop by our office during regular business hours and pick up a GoodRx coupon card.  - If you need your prescription sent electronically to a different pharmacy, notify our office through [facility] or by phone at [phone]

## 2023-06-26 ENCOUNTER — Other Ambulatory Visit (HOSPITAL_COMMUNITY): Payer: Self-pay

## 2023-06-26 ENCOUNTER — Telehealth: Payer: Self-pay

## 2023-06-26 NOTE — Telephone Encounter (Signed)
Pharmacy Patient Advocate Encounter   Received notification from CoverMyMeds that prior authorization for Zepbound is required/requested.   Insurance verification completed.   The patient is insured through [facility] .   Per test claim: PA required; PA submitted to [facility] via CoverMyMeds Key/confirmation #/EOC Key: [id number]  Status is pending

## 2023-06-27 ENCOUNTER — Other Ambulatory Visit (HOSPITAL_COMMUNITY): Payer: Self-pay

## 2023-06-27 NOTE — Telephone Encounter (Signed)
Pharmacy Patient Advocate Encounter  Received notification from [facility] that Prior Authorization for ZEPBOUND has been APPROVED from 9.24.24 to 5.23.25. Ran test claim, The Rx was last filled 9.17.24 . This test claim was processed through [facility]- copay amounts may vary at other pharmacies due to pharmacy/plan contracts, or as the patient moves through the different stages of their insurance plan.   PA #/Case ID/Reference #: Key: [id number]

## 2023-06-29 DIAGNOSIS — M25511 Pain in right shoulder: Secondary | ICD-10-CM | POA: Diagnosis not present

## 2023-06-29 DIAGNOSIS — M25571 Pain in right ankle and joints of right foot: Secondary | ICD-10-CM | POA: Diagnosis not present

## 2023-06-29 DIAGNOSIS — R760 Raised antibody titer: Secondary | ICD-10-CM | POA: Diagnosis not present

## 2023-06-29 DIAGNOSIS — M064 Inflammatory polyarthropathy: Secondary | ICD-10-CM | POA: Diagnosis not present

## 2023-07-02 DIAGNOSIS — M5431 Sciatica, right side: Secondary | ICD-10-CM | POA: Diagnosis not present

## 2023-07-02 DIAGNOSIS — M9903 Segmental and somatic dysfunction of lumbar region: Secondary | ICD-10-CM | POA: Diagnosis not present

## 2023-07-03 ENCOUNTER — Other Ambulatory Visit (HOSPITAL_COMMUNITY): Payer: Self-pay

## 2023-07-06 ENCOUNTER — Encounter: Payer: Self-pay | Admitting: Gastroenterology

## 2023-07-06 ENCOUNTER — Ambulatory Visit (INDEPENDENT_AMBULATORY_CARE_PROVIDER_SITE_OTHER): Payer: BC Managed Care – PPO | Admitting: Gastroenterology

## 2023-07-06 VITALS — BP 110/70 | HR 78 | Ht 67.0 in | Wt 223.0 lb

## 2023-07-06 DIAGNOSIS — K644 Residual hemorrhoidal skin tags: Secondary | ICD-10-CM | POA: Diagnosis not present

## 2023-07-06 MED ORDER — HYDROCORTISONE (PERIANAL) 2.5 % EX CREA: 1.0000 | TOPICAL_CREAM | Freq: Three times a day (TID) | CUTANEOUS | 1 refills | Status: AC

## 2023-07-06 NOTE — Progress Notes (Signed)
07/06/2023 Zoe Miles [id number] 05/04/1976   HISTORY OF PRESENT ILLNESS: This is a 47 year old female is a patient of Dr. Ardell Isaacs.  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  She had colonoscopy in 2019 as below and then underwent hemorrhoid banding x 3 at the end of 2020.  She is here today with complaints of hemorrhoidal irritation.  She said she had just a very small amount of bleeding when it first flared up, but mostly was experiencing itching and burning and discomfort.  That was a few months ago as it took a while for her to get in here and she has really not had any issues as of late.  Says that she is moving her bowels well without any constipation or straining or diarrhea.  Colonoscopy 11/2017 - Non-thrombosed external hemorrhoids found on perianal exam. - Small internal hemorrhoids. - The examination was otherwise normal on direct and retroflexion views. - No specimens collected.   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   Clotting disorder (HCC)    History of blood clots    History of DVT of lower extremity 08/19/2018   Kidney stones    Lupus    Past Surgical History:  Procedure Laterality Date   FINGER SURGERY     kindey stone removal with ureteroscopy     None      reports that she has never smoked. She has never been exposed to tobacco smoke. She has never used smokeless tobacco. She reports that she does not currently use alcohol. She reports that she does not use drugs. family history includes Breast cancer in her paternal grandmother; Cancer in her paternal grandfather and paternal grandmother; Colon cancer in her maternal aunt; Coronary artery disease in her father; Diabetes in her father and maternal grandmother; Diverticulitis in her paternal uncle; Healthy in her mother; Heart disease in her maternal grandfather; Hypercholesterolemia in her  father; Hypertension in her father; Lung cancer in her paternal grandfather. Allergies  Allergen Reactions   Ciprofloxacin Nausea And Vomiting    Pt experiences diarrhea nausea and vomiting      Outpatient Encounter Medications as of 07/06/2023  Medication Sig   cycloSPORINE (RESTASIS) 0.05 % ophthalmic emulsion Place 1 drop into both eyes 2 (two) times daily.   hydroxychloroquine (PLAQUENIL) 200 MG tablet Take 200 mg by mouth 2 (two) times daily.    norethindrone (MICRONOR,CAMILA,ERRIN) 0.35 MG tablet Take 1 tablet by mouth daily.   tirzepatide (ZEPBOUND) 15 MG/0.5ML Pen Inject 15 mg into the skin once a week.   warfarin (COUMADIN) 5 MG tablet TAKE 2 TABLETS DAILY BY MOUTH EXCEPT TAKE 3 TABLETS ON SUNDAYS AND THURSDAYS OR AS DIRECTED BY ANTICOAGULATION CLINIC   DULoxetine (CYMBALTA) 60 MG capsule Take 1 capsule (60 mg total) by mouth daily. (Patient not taking: Reported on 07/06/2023)   fluconazole (DIFLUCAN) 150 MG tablet Take 1 tablet (150 mg total) by mouth daily. (Patient not taking: Reported on 07/06/2023)   No facility-administered encounter medications on file as of 07/06/2023.    REVIEW OF SYSTEMS  : All other systems reviewed and negative except where noted in the History of Present Illness.   PHYSICAL EXAM: BP 110/70   Pulse 78   Ht 5\' 7"  (1.702 m)   Wt 223 lb (101.2 kg)   BMI 34.93 kg/m  General: Well developed white female in no acute distress Head: Normocephalic and atraumatic Eyes:  Sclerae anicteric, conjunctiva pink. Ears: Normal auditory acuity Rectal: Very small noninflamed nonbleeding external hemorrhoids noted.  DRE did not reveal any masses or elicit any pain.  Very small amount of light brown stool noted on the exam glove.  Anoscopy revealed very small internal hemorrhoids as well that were noninflamed and nonbleeding. Musculoskeletal: Symmetrical with no gross deformities  Skin: No lesions on visible extremities Extremities: No edema  Neurological: Alert  oriented x 4, grossly non-focal Psychological:  Alert and cooperative. Normal mood and affect  ASSESSMENT AND PLAN: 47 year old female with complaints of perianal itching and burning, which has now been resolved for the past few months.  She had internal hemorrhoid banding x 3 at the end of 2020.  I really think that her small external hemorrhoids are causing the intermittent symptoms that she is describing.  Will prescribe hydrocortisone cream to use up to 3 times a day on an as needed basis.  We did discuss that if she wanted a surgical consult we can certainly consider that.  Prescription sent to pharmacy.  She will hold off on surgical consult for now, but will contact us if she changes her mind.  CC:  Etta Grandchild, MD

## 2023-07-06 NOTE — Patient Instructions (Signed)
We have sent the following medications to your pharmacy for you to pick up at your convenience: Hydrocortisone 2.5 % apply 3 times daily as needed.  _______________________________________________________  If your blood pressure at your visit was 140/90 or greater, please contact your primary care physician to follow up on this.  _______________________________________________________  If you are age 46 or older, your body mass index should be between 23-30. Your Body mass index is 34.93 kg/m. If this is out of the aforementioned range listed, please consider follow up with your Primary Care Provider.  If you are age 62 or younger, your body mass index should be between 19-25. Your Body mass index is 34.93 kg/m. If this is out of the aformentioned range listed, please consider follow up with your Primary Care Provider.   ________________________________________________________  The [facility] providers would like to encourage you to use [facility] to communicate with providers for non-urgent requests or questions.  Due to long hold times on the telephone, sending your provider a message by [facility] may be a faster and more efficient way to get a response.  Please allow 48 business hours for a response.  Please remember that this is for non-urgent requests.  _______________________________________________________

## 2023-07-23 DIAGNOSIS — M5431 Sciatica, right side: Secondary | ICD-10-CM | POA: Diagnosis not present

## 2023-07-23 DIAGNOSIS — M9903 Segmental and somatic dysfunction of lumbar region: Secondary | ICD-10-CM | POA: Diagnosis not present

## 2023-07-24 ENCOUNTER — Telehealth: Payer: Self-pay

## 2023-07-24 NOTE — Telephone Encounter (Signed)
Pt LVM reporting she is scheduled for eyebrow microblade procedure on 11/8. Pt will need a Lovenox bridge around this procedure. Pt has been advised before prior eyebrow microblade procedures that this is not recommended since she is on anticoagulants. Pt is aware Next INR check is 11/1. If dosing is adjusted at this apt there may be adjustments to Lovenox bridge dosing.  Current warfarin dosing; 10 mg daily except take 15 mg on Sunday and Thursday  Actual Wt: 101 kg Ideal Wt: 61 kg (166% > actual wt) Adjusted Wt: 77 kg (used to calculate CrCl)  CrCl: 119.94 mL/min  Lovenox suggested dosing is 150 mg once daily around procedure.  Lovenox bridge:  11/3: Take last dose of warfarin 11/4: NO warfarin, NO Lovenox 11/5: NO warfarin, Lovenox injection in AM 11/6: NO warfarin, Lovenox injection in AM 11/7: NO warfarin, Lovenox injection in AM (Before 7AM)  11/8: PROCEDURE; NO WARFARIN, NO LOVENOX  11/9: Take 3 tablets (15 mg) warfarin, Lovenox injection in AM 11/10: Take 4 1/2 tablets (22.5 mg) warfarin, Lovenox injection in AM 11/11: Take 3 tablets (15 mg) warfarin, Lovenox injection in AM 11/12: Take 3 tablets (15 mg) warfarin, Lovenox injection in AM 11/13: Take 2 tablets (10 mg) warfarin, Lovenox injection in AM 11/14: Take 3 tablets (15 mg) warfarin, Lovenox injection in AM 11/15: RECHECK INR; NO WARFARIN, NO LOVENOX UNTIL AFTER RECHECK

## 2023-07-25 NOTE — Telephone Encounter (Signed)
LVM that pt will receive Lovenox bridge instructions at coumadin clinic apt on 11/1.

## 2023-08-01 NOTE — Patient Instructions (Addendum)
Pre visit review using our clinic review tool, if applicable. No additional management support is needed unless otherwise documented below in the visit note.  Continue 2 tablets daily except take 3 tablets on Sundays and Thursday until you start the Lovenox bridge instructions below. 11/3: Take last dose of warfarin 11/4: NO warfarin, NO Lovenox 11/5: NO warfarin, Lovenox injection in AM 11/6: NO warfarin, Lovenox injection in AM 11/7: NO warfarin, Lovenox injection in AM (Before 7AM)   11/8: PROCEDURE; NO WARFARIN, NO LOVENOX   11/9: Take 3 tablets (15 mg) warfarin, Lovenox injection in AM 11/10: Take 4 1/2 tablets (22.5 mg) warfarin, Lovenox injection in AM 11/11: Take 3 tablets (15 mg) warfarin, Lovenox injection in AM 11/12: Take 3 tablets (15 mg) warfarin, Lovenox injection in AM 11/13: Take 2 tablets (10 mg) warfarin, Lovenox injection in AM 11/14: Take 3 tablets (15 mg) warfarin, Lovenox injection in AM 11/15: RECHECK INR; NO WARFARIN, NO LOVENOX UNTIL AFTER RECHECK

## 2023-08-03 ENCOUNTER — Ambulatory Visit (INDEPENDENT_AMBULATORY_CARE_PROVIDER_SITE_OTHER): Payer: BC Managed Care – PPO

## 2023-08-03 DIAGNOSIS — Z7901 Long term (current) use of anticoagulants: Secondary | ICD-10-CM

## 2023-08-03 LAB — POCT INR: INR: 2.1 (ref 2.0–3.0)

## 2023-08-03 MED ORDER — ENOXAPARIN SODIUM 150 MG/ML IJ SOSY: 150.0000 mg | PREFILLED_SYRINGE | INTRAMUSCULAR | 0 refills | Status: DC

## 2023-08-03 NOTE — Progress Notes (Signed)
Pt having eyebrow micro-blading on 11/8 and will be on a lovenox bridge.  Continue 2 tablets daily except take 3 tablets on Sundays and Thursday until you start the Lovenox bridge instructions below. 11/3: Take last dose of warfarin 11/4: NO warfarin, NO Lovenox 11/5: NO warfarin, Lovenox injection in AM 11/6: NO warfarin, Lovenox injection in AM 11/7: NO warfarin, Lovenox injection in AM (Before 7AM)   11/8: PROCEDURE; NO WARFARIN, NO LOVENOX   11/9: Take 3 tablets (15 mg) warfarin, Lovenox injection in AM 11/10: Take 4 1/2 tablets (22.5 mg) warfarin, Lovenox injection in AM 11/11: Take 3 tablets (15 mg) warfarin, Lovenox injection in AM 11/12: Take 3 tablets (15 mg) warfarin, Lovenox injection in AM 11/13: Take 2 tablets (10 mg) warfarin, Lovenox injection in AM 11/14: Take 3 tablets (15 mg) warfarin, Lovenox injection in AM 11/15: RECHECK INR; NO WARFARIN, NO LOVENOX UNTIL AFTER RECHECK  Sent in Lovenox prescription to pt's pharmacy of choice.

## 2023-08-13 DIAGNOSIS — M9903 Segmental and somatic dysfunction of lumbar region: Secondary | ICD-10-CM | POA: Diagnosis not present

## 2023-08-13 DIAGNOSIS — M5431 Sciatica, right side: Secondary | ICD-10-CM | POA: Diagnosis not present

## 2023-08-17 ENCOUNTER — Ambulatory Visit (INDEPENDENT_AMBULATORY_CARE_PROVIDER_SITE_OTHER): Payer: BC Managed Care – PPO

## 2023-08-17 DIAGNOSIS — Z7901 Long term (current) use of anticoagulants: Secondary | ICD-10-CM | POA: Diagnosis not present

## 2023-08-17 LAB — POCT INR: INR: 2.2 (ref 2.0–3.0)

## 2023-08-17 NOTE — Patient Instructions (Addendum)
Pre visit review using our clinic review tool, if applicable. No additional management support is needed unless otherwise documented below in the visit note.  Continue 2 tablets daily except take 3 tablets on Sundays and Thursdays. Recheck in 5 weeks.

## 2023-08-17 NOTE — Progress Notes (Signed)
Continue 2 tablets daily except take 3 tablets on Sundays and Thursdays. Recheck in 5 weeks.

## 2023-08-27 DIAGNOSIS — M9903 Segmental and somatic dysfunction of lumbar region: Secondary | ICD-10-CM | POA: Diagnosis not present

## 2023-08-27 DIAGNOSIS — M5431 Sciatica, right side: Secondary | ICD-10-CM | POA: Diagnosis not present

## 2023-09-17 DIAGNOSIS — M9903 Segmental and somatic dysfunction of lumbar region: Secondary | ICD-10-CM | POA: Diagnosis not present

## 2023-09-17 DIAGNOSIS — M5431 Sciatica, right side: Secondary | ICD-10-CM | POA: Diagnosis not present

## 2023-09-18 DIAGNOSIS — N390 Urinary tract infection, site not specified: Secondary | ICD-10-CM | POA: Diagnosis not present

## 2023-09-18 DIAGNOSIS — Z6833 Body mass index (BMI) 33.0-33.9, adult: Secondary | ICD-10-CM | POA: Diagnosis not present

## 2023-09-18 DIAGNOSIS — R350 Frequency of micturition: Secondary | ICD-10-CM | POA: Diagnosis not present

## 2023-09-21 ENCOUNTER — Ambulatory Visit (INDEPENDENT_AMBULATORY_CARE_PROVIDER_SITE_OTHER): Payer: BC Managed Care – PPO

## 2023-09-21 DIAGNOSIS — Z7901 Long term (current) use of anticoagulants: Secondary | ICD-10-CM | POA: Diagnosis not present

## 2023-09-21 LAB — POCT INR: INR: 2.5 (ref 2.0–3.0)

## 2023-09-21 MED ORDER — ENOXAPARIN SODIUM 150 MG/ML IJ SOSY: 150.0000 mg | PREFILLED_SYRINGE | INTRAMUSCULAR | 0 refills | Status: DC

## 2023-09-21 NOTE — Progress Notes (Addendum)
Pt having eyebrow microblade procedure on 12/30. Pt will be on a lovenox bridge. Continue 2 tablets daily except take 3 tablets on Sundays and Thursdays until starting Lovenox bridge instructions.   Pt called after apt and reports her procedure is actually on 12/27 and not 12/30. Updated lovenox bridge instructions and sent msg with new instructions via mychart.  12/22: Take last dose of warfarin 12/23: NO warfarin, NO Lovenox 12/24: NO warfarin, Lovenox injection in AM 12/25: NO warfarin, Lovenox injection in AM 12/26: NO warfarin, Lovenox injection in AM (Before 7AM)   12/27: PROCEDURE; NO WARFARIN, NO LOVENOX   12/28: Take 3 tablets (15 mg) warfarin, Lovenox injection in AM 12/29: Take 4 1/2  tablets (22.5 mg) warfarin, Lovenox injection in AM 12/30: Take 3 tablets (15 mg) warfarin, Lovenox injection in AM 12/31: Take 3 tablets (15 mg) warfarin, Lovenox injection in AM 1/1: Take 2 tablets (10 mg) warfarin, Lovenox injection in AM 1/2: Take 3 tablets (15 mg) warfarin, Lovenox injection in AM 1/3:  RECHECK INR; NO WARFARIN, NO LOVENOX UNTIL AFTER RECHECK   Sent in Lovenox injections

## 2023-09-21 NOTE — Patient Instructions (Addendum)
Pre visit review using our clinic review tool, if applicable. No additional management support is needed unless otherwise documented below in the visit note.  Continue 2 tablets daily except take 3 tablets on Sundays and Thursdays until starting Lovenox bridge instructions. 12/22: Take last dose of warfarin 12/23: NO warfarin, NO Lovenox 12/24: NO warfarin, Lovenox injection in AM 12/25: NO warfarin, Lovenox injection in AM 12/26: NO warfarin, Lovenox injection in AM (Before 7AM)   12/27: PROCEDURE; NO WARFARIN, NO LOVENOX   12/28: Take 3 tablets (15 mg) warfarin, Lovenox injection in AM 12/29: Take 4 1/2  tablets (22.5 mg) warfarin, Lovenox injection in AM 12/30: Take 3 tablets (15 mg) warfarin, Lovenox injection in AM 12/31: Take 3 tablets (15 mg) warfarin, Lovenox injection in AM 1/1: Take 2 tablets (10 mg) warfarin, Lovenox injection in AM 1/2: Take 3 tablets (15 mg) warfarin, Lovenox injection in AM 1/3:  RECHECK INR; NO WARFARIN, NO LOVENOX UNTIL AFTER RECHECK

## 2023-09-27 DIAGNOSIS — N920 Excessive and frequent menstruation with regular cycle: Secondary | ICD-10-CM | POA: Diagnosis not present

## 2023-09-27 DIAGNOSIS — Z01419 Encounter for gynecological examination (general) (routine) without abnormal findings: Secondary | ICD-10-CM | POA: Diagnosis not present

## 2023-09-27 DIAGNOSIS — Z124 Encounter for screening for malignant neoplasm of cervix: Secondary | ICD-10-CM | POA: Diagnosis not present

## 2023-10-01 DIAGNOSIS — M9903 Segmental and somatic dysfunction of lumbar region: Secondary | ICD-10-CM | POA: Diagnosis not present

## 2023-10-01 DIAGNOSIS — M5431 Sciatica, right side: Secondary | ICD-10-CM | POA: Diagnosis not present

## 2023-10-05 ENCOUNTER — Ambulatory Visit (INDEPENDENT_AMBULATORY_CARE_PROVIDER_SITE_OTHER): Payer: BC Managed Care – PPO

## 2023-10-05 DIAGNOSIS — Z7901 Long term (current) use of anticoagulants: Secondary | ICD-10-CM | POA: Diagnosis not present

## 2023-10-05 LAB — POCT INR: INR: 1.7 — AB (ref 2.0–3.0)

## 2023-10-05 NOTE — Progress Notes (Signed)
 Pt had eyebrow microblade procedure on 12/27. Pt has been on a lovenox  bridge. Use last Lovenox  injections today and then stop Lovenox  Increase dose today to take 3 tablets and then continue 2 tablets daily except take 3 tablets on Sundays and Thursdays. Recheck in 3 weeks.

## 2023-10-05 NOTE — Patient Instructions (Addendum)
 Pre visit review using our clinic review tool, if applicable. No additional management support is needed unless otherwise documented below in the visit note.  Use last Lovenox  injections today and then stop Lovenox  Increase dose today to take 3 tablets and then continue 2 tablets daily except take 3 tablets on Sundays and Thursdays. Recheck in 3 weeks.

## 2023-10-09 ENCOUNTER — Other Ambulatory Visit: Payer: Self-pay | Admitting: Internal Medicine

## 2023-10-09 ENCOUNTER — Ambulatory Visit: Payer: BC Managed Care – PPO

## 2023-10-09 DIAGNOSIS — E66812 Obesity, class 2: Secondary | ICD-10-CM

## 2023-10-16 ENCOUNTER — Other Ambulatory Visit: Payer: Self-pay | Admitting: Internal Medicine

## 2023-10-16 DIAGNOSIS — D6862 Lupus anticoagulant syndrome: Secondary | ICD-10-CM

## 2023-10-17 DIAGNOSIS — Z124 Encounter for screening for malignant neoplasm of cervix: Secondary | ICD-10-CM | POA: Diagnosis not present

## 2023-10-17 DIAGNOSIS — N920 Excessive and frequent menstruation with regular cycle: Secondary | ICD-10-CM | POA: Diagnosis not present

## 2023-10-22 DIAGNOSIS — M5431 Sciatica, right side: Secondary | ICD-10-CM | POA: Diagnosis not present

## 2023-10-22 DIAGNOSIS — M9903 Segmental and somatic dysfunction of lumbar region: Secondary | ICD-10-CM | POA: Diagnosis not present

## 2023-10-26 ENCOUNTER — Ambulatory Visit: Payer: BC Managed Care – PPO

## 2023-10-26 ENCOUNTER — Ambulatory Visit (INDEPENDENT_AMBULATORY_CARE_PROVIDER_SITE_OTHER): Payer: BC Managed Care – PPO

## 2023-10-26 DIAGNOSIS — Z7901 Long term (current) use of anticoagulants: Secondary | ICD-10-CM

## 2023-10-26 LAB — POCT INR: INR: 2.3 (ref 2.0–3.0)

## 2023-10-26 NOTE — Patient Instructions (Addendum)
Pre visit review using our clinic review tool, if applicable. No additional management support is needed unless otherwise documented below in the visit note.  Increase dose today to take 3 tablets and then change weekly dose to take 2 tablets daily except take 3 tablets on Sundays, Tuesday and Thursdays. Recheck in 3 weeks.

## 2023-10-26 NOTE — Progress Notes (Signed)
Increase dose today to take 3 tablets and then change weekly dose to take 2 tablets daily except take 3 tablets on Sundays, Tuesday and Thursdays. Recheck in 3 weeks.

## 2023-11-08 ENCOUNTER — Other Ambulatory Visit (HOSPITAL_COMMUNITY): Payer: Self-pay

## 2023-11-08 DIAGNOSIS — M5431 Sciatica, right side: Secondary | ICD-10-CM | POA: Diagnosis not present

## 2023-11-08 DIAGNOSIS — M9903 Segmental and somatic dysfunction of lumbar region: Secondary | ICD-10-CM | POA: Diagnosis not present

## 2023-11-09 ENCOUNTER — Telehealth: Payer: Self-pay

## 2023-11-09 ENCOUNTER — Other Ambulatory Visit (HOSPITAL_COMMUNITY): Payer: Self-pay

## 2023-11-09 NOTE — Telephone Encounter (Signed)
 Pharmacy Patient Advocate Encounter   Received notification from CoverMyMeds that prior authorization for Zepbound  15MG /0.5ML pen-injectors is required/requested.   Insurance verification completed.   The patient is insured through  [employer]  .   Per test claim: Refill too soon. PA is not needed at this time. Medication was filled 11/06/2023. Next eligible fill date is 01/08/2024.

## 2023-11-16 ENCOUNTER — Ambulatory Visit: Payer: BC Managed Care – PPO

## 2023-11-16 DIAGNOSIS — Z7901 Long term (current) use of anticoagulants: Secondary | ICD-10-CM | POA: Diagnosis not present

## 2023-11-16 LAB — POCT INR: INR: 3.7 — AB (ref 2.0–3.0)

## 2023-11-16 NOTE — Progress Notes (Signed)
Hold dose today and then change weekly dose to take  2 tablets daily except take 3 tablets on Sundays and Thursdays. Recheck in 3 weeks.

## 2023-11-16 NOTE — Patient Instructions (Addendum)
Pre visit review using our clinic review tool, if applicable. No additional management support is needed unless otherwise documented below in the visit note.  Hold dose today and then change weekly dose to take  2 tablets daily except take 3 tablets on Sundays and Thursdays. Recheck in 3 weeks.

## 2023-11-26 DIAGNOSIS — M5431 Sciatica, right side: Secondary | ICD-10-CM | POA: Diagnosis not present

## 2023-11-26 DIAGNOSIS — M9903 Segmental and somatic dysfunction of lumbar region: Secondary | ICD-10-CM | POA: Diagnosis not present

## 2023-12-07 ENCOUNTER — Ambulatory Visit: Payer: BC Managed Care – PPO

## 2023-12-07 DIAGNOSIS — Z7901 Long term (current) use of anticoagulants: Secondary | ICD-10-CM

## 2023-12-07 LAB — POCT INR: INR: 2.3 (ref 2.0–3.0)

## 2023-12-07 NOTE — Progress Notes (Signed)
 Continue  2 tablets daily except take 3 tablets on Sundays and Thursdays. Recheck in 4 weeks.

## 2023-12-07 NOTE — Patient Instructions (Addendum)
 Pre visit review using our clinic review tool, if applicable. No additional management support is needed unless otherwise documented below in the visit note.  Continue  2 tablets daily except take 3 tablets on Sundays and Thursdays. Recheck in 4 weeks.

## 2023-12-12 LAB — HM PAP SMEAR

## 2023-12-14 DIAGNOSIS — Z86718 Personal history of other venous thrombosis and embolism: Secondary | ICD-10-CM | POA: Diagnosis not present

## 2023-12-14 DIAGNOSIS — R760 Raised antibody titer: Secondary | ICD-10-CM | POA: Diagnosis not present

## 2023-12-17 ENCOUNTER — Ambulatory Visit: Payer: BC Managed Care – PPO

## 2023-12-17 DIAGNOSIS — M9903 Segmental and somatic dysfunction of lumbar region: Secondary | ICD-10-CM | POA: Diagnosis not present

## 2023-12-17 DIAGNOSIS — M5431 Sciatica, right side: Secondary | ICD-10-CM | POA: Diagnosis not present

## 2023-12-28 DIAGNOSIS — R760 Raised antibody titer: Secondary | ICD-10-CM | POA: Diagnosis not present

## 2023-12-28 DIAGNOSIS — M25571 Pain in right ankle and joints of right foot: Secondary | ICD-10-CM | POA: Diagnosis not present

## 2023-12-28 DIAGNOSIS — M25511 Pain in right shoulder: Secondary | ICD-10-CM | POA: Diagnosis not present

## 2023-12-28 DIAGNOSIS — M064 Inflammatory polyarthropathy: Secondary | ICD-10-CM | POA: Diagnosis not present

## 2023-12-31 ENCOUNTER — Ambulatory Visit

## 2023-12-31 ENCOUNTER — Ambulatory Visit
Admission: RE | Admit: 2023-12-31 | Discharge: 2023-12-31 | Disposition: A | Discharge status: Home / Self Care | Source: Ambulatory Visit | Attending: Internal Medicine | Admitting: Internal Medicine

## 2023-12-31 ENCOUNTER — Encounter

## 2023-12-31 ENCOUNTER — Other Ambulatory Visit

## 2023-12-31 ENCOUNTER — Other Ambulatory Visit: Payer: Self-pay | Admitting: Internal Medicine

## 2023-12-31 DIAGNOSIS — N6489 Other specified disorders of breast: Secondary | ICD-10-CM

## 2023-12-31 DIAGNOSIS — R928 Other abnormal and inconclusive findings on diagnostic imaging of breast: Secondary | ICD-10-CM | POA: Diagnosis not present

## 2024-01-04 ENCOUNTER — Ambulatory Visit

## 2024-01-07 DIAGNOSIS — M5431 Sciatica, right side: Secondary | ICD-10-CM | POA: Diagnosis not present

## 2024-01-07 DIAGNOSIS — M9903 Segmental and somatic dysfunction of lumbar region: Secondary | ICD-10-CM | POA: Diagnosis not present

## 2024-01-08 ENCOUNTER — Ambulatory Visit (INDEPENDENT_AMBULATORY_CARE_PROVIDER_SITE_OTHER)

## 2024-01-08 DIAGNOSIS — Z7901 Long term (current) use of anticoagulants: Secondary | ICD-10-CM

## 2024-01-08 LAB — POCT INR: INR: 5.5 — AB (ref 2.0–3.0)

## 2024-01-08 NOTE — Patient Instructions (Addendum)
 Pre visit review using our clinic review tool, if applicable. No additional management support is needed unless otherwise documented below in the visit note.  Hold dose today and hold dose tomorrow and then change weekly dose to take 2 tablets daily. Recheck in 1 weeks.

## 2024-01-08 NOTE — Progress Notes (Addendum)
 Pt started norethindrone. No interaction with warfarin. Pt denies any other changes and does not know why she is supra therapeutic today. Pt reported 3 days ago she wiped to aggressively after a BM and had some bright red rectal bleeding. She reports she has not had any since that time. Advised if anymore bleeding to go to ER. Pt verbalized understanding.  Hold dose today and hold dose tomorrow and then change weekly dose to take 2 tablets daily. Recheck in 1 weeks.

## 2024-01-09 ENCOUNTER — Telehealth: Payer: Self-pay

## 2024-01-09 NOTE — Telephone Encounter (Signed)
 Pt LVM she could not make apt for coumadin clinic scheduled for 4/15 due to being out of town. She can make apt for 4/17 or 4/18. 4/17 coumadin clinic is held in [place]. 4/18 the clinic is closed for Good Friday. 4/21 the coumadin clinic nurse is out of the office.  Per protocol, recheck INR in 2 weeks if INR is 4.0-5.9, and pt's INR yesterday was 5.5.  Will reschedule pt for 4/22 at [facility], which is within protocols.  Sent mychart msg with available apts.

## 2024-01-11 ENCOUNTER — Other Ambulatory Visit: Payer: Self-pay | Admitting: Internal Medicine

## 2024-01-11 DIAGNOSIS — D6862 Lupus anticoagulant syndrome: Secondary | ICD-10-CM

## 2024-01-15 ENCOUNTER — Ambulatory Visit

## 2024-01-22 ENCOUNTER — Ambulatory Visit (INDEPENDENT_AMBULATORY_CARE_PROVIDER_SITE_OTHER)

## 2024-01-22 DIAGNOSIS — Z7901 Long term (current) use of anticoagulants: Secondary | ICD-10-CM | POA: Diagnosis not present

## 2024-01-22 LAB — POCT INR: INR: 3.3 — AB (ref 2.0–3.0)

## 2024-01-22 NOTE — Progress Notes (Signed)
 Reduce dose today to take 1 tablet and then change weekly dose to take 2 tablets daily except take 1 tablet on Tuesdays. Recheck in 3 weeks.

## 2024-01-22 NOTE — Patient Instructions (Addendum)
 Pre visit review using our clinic review tool, if applicable. No additional management support is needed unless otherwise documented below in the visit note.  Reduce dose today to take 1 tablet and then change weekly dose to take 2 tablets daily except take 1 tablet on Tuesdays. Recheck in 3 weeks.

## 2024-01-28 DIAGNOSIS — R300 Dysuria: Secondary | ICD-10-CM | POA: Diagnosis not present

## 2024-01-28 DIAGNOSIS — N3000 Acute cystitis without hematuria: Secondary | ICD-10-CM | POA: Diagnosis not present

## 2024-01-28 DIAGNOSIS — M5431 Sciatica, right side: Secondary | ICD-10-CM | POA: Diagnosis not present

## 2024-01-28 DIAGNOSIS — M9903 Segmental and somatic dysfunction of lumbar region: Secondary | ICD-10-CM | POA: Diagnosis not present

## 2024-01-28 DIAGNOSIS — Z6831 Body mass index (BMI) 31.0-31.9, adult: Secondary | ICD-10-CM | POA: Diagnosis not present

## 2024-02-12 ENCOUNTER — Ambulatory Visit (INDEPENDENT_AMBULATORY_CARE_PROVIDER_SITE_OTHER)

## 2024-02-12 DIAGNOSIS — Z7901 Long term (current) use of anticoagulants: Secondary | ICD-10-CM

## 2024-02-12 LAB — POCT INR: INR: 3.1 — AB (ref 2.0–3.0)

## 2024-02-12 NOTE — Patient Instructions (Addendum)
 Pre visit review using our clinic review tool, if applicable. No additional management support is needed unless otherwise documented below in the visit note.  Reduce dose today to take 1/2 tablet and then change weekly dose to take 2 tablets daily except take 1 1/2 tablet on Monday, Wednesday and Friday. Recheck in 3 weeks.

## 2024-02-12 NOTE — Progress Notes (Signed)
 Pt has lost over 30 lbs. Reduce dose today to take 1/2 tablet and then change weekly dose to take 2 tablets daily except take 1 1/2 tablet on Monday, Wednesday and Friday. Recheck in 3 weeks.

## 2024-02-21 DIAGNOSIS — M5431 Sciatica, right side: Secondary | ICD-10-CM | POA: Diagnosis not present

## 2024-02-21 DIAGNOSIS — M9903 Segmental and somatic dysfunction of lumbar region: Secondary | ICD-10-CM | POA: Diagnosis not present

## 2024-02-26 ENCOUNTER — Other Ambulatory Visit (HOSPITAL_COMMUNITY): Payer: Self-pay

## 2024-02-26 ENCOUNTER — Telehealth: Payer: Self-pay

## 2024-02-26 NOTE — Telephone Encounter (Signed)
 Pharmacy Patient Advocate Encounter   Received notification from CoverMyMeds that prior authorization for Zepbound  2.5 is required/requested.   Insurance verification completed.   The patient is insured through [facility] .   Per test claim: PA required; PA submitted to above mentioned insurance via CoverMyMeds Key/confirmation #/EOC [id number] Status is pending

## 2024-02-27 ENCOUNTER — Encounter: Payer: Self-pay | Admitting: Internal Medicine

## 2024-02-28 ENCOUNTER — Telehealth: Payer: Self-pay

## 2024-02-28 NOTE — Telephone Encounter (Signed)
 A user error has taken place: encounter opened in error, closed for administrative reasons.

## 2024-02-28 NOTE — Telephone Encounter (Deleted)
 A user error has taken place: orders placed in error, not carried out on this patient.

## 2024-02-28 NOTE — Telephone Encounter (Signed)
 Please advise the PA needed for her Zepbound .

## 2024-02-28 NOTE — Telephone Encounter (Signed)
 Copied from CRM [id number]. Topic: Clinical - Prescription Issue >> Feb 28, 2024 10:21 AM Magdalene School wrote: Reason for CRM: Rana calling from [facility] to request prior auth for ZEPBOUND  15 MG/0.5ML Pen.  [facility] - [place], [place] - [address] [place] [address] Phone: [phone]  Fax: [phone]

## 2024-02-28 NOTE — Telephone Encounter (Signed)
 Pharmacy Patient Advocate Encounter  Received notification from [employer] [facility] that Prior Authorization for Zepbound  2.5 has been DENIED.  Full denial letter will be uploaded to the media tab. See denial reason below.   PA #/Case ID/Reference #: [id number]

## 2024-02-29 NOTE — Telephone Encounter (Signed)
 Copied from CRM [id number]. Topic: Referral - Prior Authorization Question >> Feb 29, 2024 12:21 PM Zoe Miles wrote: Reason for CRM: Patient returning call from [facility] regarding Zepbound  2.5 PA denial, patient said she was on Zepbound  15. Patient is wondering if the prior weight and most recent weight could be sent by Dr. Rochelle Chu for an appeal. If needed she said Jaz could also give her a call back.

## 2024-02-29 NOTE — Telephone Encounter (Signed)
 If I was to send the information that the insurance company is requesting is there anyway that you could get it to them or do I need to do an appeal.

## 2024-02-29 NOTE — Telephone Encounter (Signed)
 Patient has been made aware VIA detailed message left on her phone.

## 2024-03-04 ENCOUNTER — Ambulatory Visit (INDEPENDENT_AMBULATORY_CARE_PROVIDER_SITE_OTHER)

## 2024-03-04 VITALS — Wt 209.0 lb

## 2024-03-04 DIAGNOSIS — Z7901 Long term (current) use of anticoagulants: Secondary | ICD-10-CM | POA: Diagnosis not present

## 2024-03-04 LAB — POCT INR: INR: 2.6 (ref 2.0–3.0)

## 2024-03-04 NOTE — Progress Notes (Signed)
 Continue 2 tablets daily except take 1 1/2 tablet on Monday, Wednesday and Friday. Recheck in 4 weeks.   Pt reported today the PA for Zepbound  was denied due to her beginning and current wt not given on the PA. Got a current wt today of 209 lb and sending CMA msg to f/u concerning PA.

## 2024-03-04 NOTE — Telephone Encounter (Signed)
 Pt in today for coumadin  clinic. Pt reported today the PA for Zepbound  was denied due to her beginning and current wt not given on the PA. Got a current wt today of 209 lb and sending CMA msg to f/u concerning PA.

## 2024-03-04 NOTE — Patient Instructions (Addendum)
 Pre visit review using our clinic review tool, if applicable. No additional management support is needed unless otherwise documented below in the visit note.  Continue 2 tablets daily except take 1 1/2 tablet on Monday, Wednesday and Friday. Recheck in 4 weeks.

## 2024-03-05 NOTE — Telephone Encounter (Signed)
 Copied from CRM [id number]. Topic: Referral - Prior Authorization Question >> Feb 29, 2024 12:21 PM Abigail D wrote: Reason for CRM: Patient returning call from [facility] regarding Zepbound  2.5 PA denial, patient said she was on Zepbound  15. Patient is wondering if the prior weight and most recent weight could be sent by Dr. Rochelle Chu for an appeal. If needed she said Jaz could also give her a call back. >> Mar 05, 2024 10:11 AM Kita Perish H wrote: Patient is following up with [employer] regarding prior authorization needing more information needing starting weight and current weight and wants to know the status. Patient states she's supposed to take dose this week.  [employer] [phone]

## 2024-03-05 NOTE — Telephone Encounter (Signed)
 Noted

## 2024-03-05 NOTE — Telephone Encounter (Signed)
 Was the weight and BMI updated?

## 2024-03-06 ENCOUNTER — Other Ambulatory Visit (HOSPITAL_BASED_OUTPATIENT_CLINIC_OR_DEPARTMENT_OTHER): Payer: Self-pay

## 2024-03-06 ENCOUNTER — Other Ambulatory Visit (HOSPITAL_COMMUNITY): Payer: Self-pay

## 2024-03-06 DIAGNOSIS — M9903 Segmental and somatic dysfunction of lumbar region: Secondary | ICD-10-CM | POA: Diagnosis not present

## 2024-03-06 DIAGNOSIS — M5431 Sciatica, right side: Secondary | ICD-10-CM | POA: Diagnosis not present

## 2024-03-06 MED ORDER — ZEPBOUND 15 MG/0.5ML ~~LOC~~ SOAJ
15.0000 mg | SUBCUTANEOUS | 1 refills | Status: DC
Start: 2024-03-06 — End: 2024-08-21
  Filled 2024-03-06: qty 2, 28d supply, fill #0
  Filled 2024-03-28: qty 2, 28d supply, fill #1
  Filled 2024-04-26: qty 2, 28d supply, fill #2
  Filled 2024-05-23: qty 2, 28d supply, fill #3
  Filled 2024-06-19: qty 2, 28d supply, fill #4
  Filled 2024-07-17: qty 2, 28d supply, fill #5

## 2024-03-06 NOTE — Telephone Encounter (Signed)
 Pharmacy Patient Advocate Encounter  Received notification from [employer] that Prior Authorization for Zepbound  15 has been APPROVED from 03/05/24 to 03/05/25. Ran test claim, Copay is $24.99. This test claim was processed through [facility]- copay amounts may vary at other pharmacies due to pharmacy/plan contracts, or as the patient moves through the different stages of their insurance plan.   PA #/Case ID/Reference #: [id number]

## 2024-03-06 NOTE — Telephone Encounter (Signed)
 Pt called this morning checking on PA. Advised it has just been approved. Pt requested script sent to [facility].  Sent in script to requested pharmacy.

## 2024-03-06 NOTE — Addendum Note (Signed)
 Addended by: Ian Maine A on: 03/06/2024 08:58 AM   Modules accepted: Orders

## 2024-03-07 DIAGNOSIS — H53453 Other localized visual field defect, bilateral: Secondary | ICD-10-CM | POA: Diagnosis not present

## 2024-03-07 DIAGNOSIS — Z79899 Other long term (current) drug therapy: Secondary | ICD-10-CM | POA: Diagnosis not present

## 2024-03-21 DIAGNOSIS — N920 Excessive and frequent menstruation with regular cycle: Secondary | ICD-10-CM | POA: Diagnosis not present

## 2024-03-24 DIAGNOSIS — M5431 Sciatica, right side: Secondary | ICD-10-CM | POA: Diagnosis not present

## 2024-03-24 DIAGNOSIS — M9903 Segmental and somatic dysfunction of lumbar region: Secondary | ICD-10-CM | POA: Diagnosis not present

## 2024-03-28 ENCOUNTER — Other Ambulatory Visit (HOSPITAL_BASED_OUTPATIENT_CLINIC_OR_DEPARTMENT_OTHER): Payer: Self-pay

## 2024-03-31 ENCOUNTER — Ambulatory Visit (INDEPENDENT_AMBULATORY_CARE_PROVIDER_SITE_OTHER): Admitting: Internal Medicine

## 2024-03-31 VITALS — BP 124/68 | HR 78 | Temp 99.5°F | Resp 16 | Ht 67.0 in | Wt 202.2 lb

## 2024-03-31 DIAGNOSIS — Z0000 Encounter for general adult medical examination without abnormal findings: Secondary | ICD-10-CM

## 2024-03-31 DIAGNOSIS — Z23 Encounter for immunization: Secondary | ICD-10-CM

## 2024-03-31 DIAGNOSIS — Z0001 Encounter for general adult medical examination with abnormal findings: Secondary | ICD-10-CM

## 2024-03-31 DIAGNOSIS — I10 Essential (primary) hypertension: Secondary | ICD-10-CM

## 2024-03-31 DIAGNOSIS — M1990 Unspecified osteoarthritis, unspecified site: Secondary | ICD-10-CM | POA: Insufficient documentation

## 2024-03-31 NOTE — Progress Notes (Unsigned)
 Subjective:  Patient ID: Zoe Miles, female    DOB: 1976/05/11  Age: 48 y.o. MRN: [id number]  CC: Annual Exam   HPI Zoe Miles presents for a CPX and f/up ----  Discussed the use of AI scribe software for clinical note transcription with the patient, who gave verbal consent to proceed.  History of Present Illness   Zoe Miles is a 48 year old female who presents for a follow-up regarding heavy menstrual bleeding and medication management.  She underwent a Pap smear in March followed by a biopsy two weeks ago to investigate heavy menstrual bleeding. The biopsy results were normal. She was prescribed norethindrone 5 mg to stop her menstrual cycles, which has been effective, although she experiences occasional spotting. The dose remains at 5 mg due to continued spotting.  She reports no side effects from her current medications, including Zepbound , except for occasional indigestion when consuming alcohol. No painful swallowing, trouble swallowing, belching, abdominal pain, or constipation. Occasional flatulence is noted but not considered excessive compared to her baseline.  Her last colonoscopy in 2019 was normal. She is on hydroxychloroquine and undergoes yearly vision tests to monitor for any changes in vision.  No chest pain, dizziness, or lightheadedness during physical activity.       Outpatient Medications Prior to Visit  Medication Sig Dispense Refill   cycloSPORINE (RESTASIS) 0.05 % ophthalmic emulsion Place 1 drop into both eyes 2 (two) times daily.     hydrocortisone  (ANUSOL -HC) 2.5 % rectal cream Place 1 Application rectally 3 (three) times daily. 45 g 1   hydroxychloroquine (PLAQUENIL) 200 MG tablet Take 200 mg by mouth 2 (two) times daily.      norethindrone (AYGESTIN) 5 MG tablet Take 5 mg by mouth daily.     tirzepatide  (ZEPBOUND ) 15 MG/0.5ML Pen Inject 15 mg into the skin once a week. 6 mL 1   warfarin (COUMADIN ) 5 MG tablet TAKE 2 TABLETS  DAILY EXCEPT 3 TABLETS ON SUNDAYS AND THURSDAYS OR AS DIRECTED BY CLINIC 208 tablet 0   DULoxetine  (CYMBALTA ) 60 MG capsule Take 1 capsule (60 mg total) by mouth daily. (Patient not taking: Reported on 07/06/2023) 90 capsule 1   enoxaparin  (LOVENOX ) 150 MG/ML injection Inject 1 mL (150 mg total) into the skin daily. TAKE AS DIRECTED BY ANTICOAGULATION CLINIC 9 mL 0   enoxaparin  (LOVENOX ) 150 MG/ML injection Inject 1 mL (150 mg total) into the skin daily. INJECT AS DIRECTED BY ANTICOAGULATION CLINIC 10 mL 0   fluconazole  (DIFLUCAN ) 150 MG tablet Take 1 tablet (150 mg total) by mouth daily. (Patient not taking: Reported on 07/06/2023) 1 tablet 3   norethindrone (MICRONOR,CAMILA,ERRIN) 0.35 MG tablet Take 1 tablet by mouth daily.     No facility-administered medications prior to visit.    ROS Review of Systems  Objective:  BP 124/68 (BP Location: Left Arm, Patient Position: Sitting, Cuff Size: Normal)   Pulse 78   Temp 99.5 F (37.5 C) (Oral)   Resp 16   Ht 5' 7 (1.702 m)   Wt 202 lb 3.2 oz (91.7 kg)   LMP 12/11/2023   SpO2 98%   BMI 31.67 kg/m   BP Readings from Last 3 Encounters:  03/31/24 124/68  07/06/23 110/70  06/25/23 129/77    Wt Readings from Last 3 Encounters:  03/31/24 202 lb 3.2 oz (91.7 kg)  03/04/24 209 lb (94.8 kg)  07/06/23 223 lb (101.2 kg)    Physical Exam  Lab Results  Component Value  Date   WBC 5.5 03/12/2023   HGB 14.3 03/12/2023   HCT 43.9 03/12/2023   PLT 246.0 03/12/2023   GLUCOSE 81 03/12/2023   CHOL 118 03/12/2023   TRIG 41.0 03/12/2023   HDL 69.90 03/12/2023   LDLCALC 40 03/12/2023   ALT 16 03/12/2023   AST 20 03/12/2023   NA 138 03/12/2023   K 4.1 03/12/2023   CL 104 03/12/2023   CREATININE 0.72 03/12/2023   BUN 12 03/12/2023   CO2 25 03/12/2023   TSH 2.09 03/12/2023   INR 2.6 03/04/2024   HGBA1C 5.5 03/08/2022    MM 3D DIAGNOSTIC MAMMOGRAM UNILATERAL LEFT BREAST Result Date: 12/31/2023 CLINICAL DATA:  48 year old female  presenting for short-term follow-up of a probably benign left breast asymmetry without sonographic correlate. EXAM: DIGITAL DIAGNOSTIC UNILATERAL LEFT MAMMOGRAM WITH TOMOSYNTHESIS AND CAD TECHNIQUE: Left digital diagnostic mammography and breast tomosynthesis was performed. The images were evaluated with computer-aided detection. COMPARISON:  Previous exam(s). ACR Breast Density Category b: There are scattered areas of fibroglandular density. FINDINGS: Full field tomosynthesis views of the left breast performed. There is a stable focal asymmetry in the upper outer left breast likely representing a band of normal tissue. There are no new suspicious findings elsewhere in the left breast. IMPRESSION: Stable probably benign focal asymmetry in the upper outer left breast. RECOMMENDATION: Diagnostic bilateral mammogram in 6 months. I have discussed the findings and recommendations with the patient. If applicable, a reminder letter will be sent to the patient regarding the next appointment. BI-RADS CATEGORY  3: Probably benign. Electronically Signed   By: Inocente Ast M.D.   On: 12/31/2023 14:56    Assessment & Plan:  Primary hypertension -     Basic metabolic panel with GFR; Future -     CBC with Differential/Platelet; Future -     Hepatic function panel; Future -     TSH; Future  Immunization due -     Heplisav-B (HepB-CPG) Vaccine  Encounter for general adult medical examination with abnormal findings     Follow-up: Return in about 6 months (around 09/30/2024).  Debby Molt, MD

## 2024-03-31 NOTE — Patient Instructions (Signed)

## 2024-04-01 ENCOUNTER — Other Ambulatory Visit (INDEPENDENT_AMBULATORY_CARE_PROVIDER_SITE_OTHER)

## 2024-04-01 ENCOUNTER — Ambulatory Visit

## 2024-04-01 DIAGNOSIS — I10 Essential (primary) hypertension: Secondary | ICD-10-CM | POA: Diagnosis not present

## 2024-04-01 DIAGNOSIS — Z7901 Long term (current) use of anticoagulants: Secondary | ICD-10-CM

## 2024-04-01 LAB — BASIC METABOLIC PANEL WITH GFR
BUN: 14 mg/dL (ref 6–23)
CO2: 25 meq/L (ref 19–32)
Calcium: 9.1 mg/dL (ref 8.4–10.5)
Chloride: 106 meq/L (ref 96–112)
Creatinine, Ser: 0.68 mg/dL (ref 0.40–1.20)
GFR: 104.32 mL/min (ref >60.00)
Glucose, Bld: 83 mg/dL (ref 70–99)
Potassium: 3.9 meq/L (ref 3.5–5.1)
Sodium: 139 meq/L (ref 135–145)

## 2024-04-01 LAB — CBC WITH DIFFERENTIAL/PLATELET
Basophils Absolute: 0 10*3/uL (ref 0.0–0.1)
Basophils Relative: 0.8 % (ref 0.0–3.0)
Eosinophils Absolute: 0.1 10*3/uL (ref 0.0–0.7)
Eosinophils Relative: 2.3 % (ref 0.0–5.0)
HCT: 41.7 % (ref 36.0–46.0)
Hemoglobin: 13.9 g/dL (ref 12.0–15.0)
Lymphocytes Relative: 25.3 % (ref 12.0–46.0)
Lymphs Abs: 1.3 10*3/uL (ref 0.7–4.0)
MCHC: 33.4 g/dL (ref 30.0–36.0)
MCV: 89.5 fl (ref 78.0–100.0)
Monocytes Absolute: 0.5 10*3/uL (ref 0.1–1.0)
Monocytes Relative: 9.1 % (ref 3.0–12.0)
Neutro Abs: 3.2 10*3/uL (ref 1.4–7.7)
Neutrophils Relative %: 62.5 % (ref 43.0–77.0)
Platelets: 240 10*3/uL (ref 150.0–400.0)
RBC: 4.65 Mil/uL (ref 3.87–5.11)
RDW: 14.2 % (ref 11.5–15.5)
WBC: 5.2 10*3/uL (ref 4.0–10.5)

## 2024-04-01 LAB — HEPATIC FUNCTION PANEL
ALT: 22 U/L (ref 0–35)
AST: 18 U/L (ref 0–37)
Albumin: 4.3 g/dL (ref 3.5–5.2)
Alkaline Phosphatase: 28 U/L — ABNORMAL LOW (ref 39–117)
Bilirubin, Direct: 0.1 mg/dL (ref 0.0–0.3)
Total Bilirubin: 0.4 mg/dL (ref 0.2–1.2)
Total Protein: 7.1 g/dL (ref 6.0–8.3)

## 2024-04-01 LAB — TSH: TSH: 1.87 u[IU]/mL (ref 0.35–5.50)

## 2024-04-01 LAB — POCT INR: INR: 2.4 (ref 2.0–3.0)

## 2024-04-01 NOTE — Patient Instructions (Addendum)
 Pre visit review using our clinic review tool, if applicable. No additional management support is needed unless otherwise documented below in the visit note.  Continue 2 tablets daily except take 1 1/2 tablet on Monday, Wednesday and Friday. Recheck in 6 weeks.

## 2024-04-01 NOTE — Progress Notes (Signed)
 Continue 2 tablets daily except take 1 1/2 tablet on Monday, Wednesday and Friday. Recheck in 6 weeks.

## 2024-04-02 ENCOUNTER — Ambulatory Visit: Payer: Self-pay | Admitting: Internal Medicine

## 2024-04-13 ENCOUNTER — Other Ambulatory Visit: Payer: Self-pay | Admitting: Internal Medicine

## 2024-04-13 DIAGNOSIS — Z7901 Long term (current) use of anticoagulants: Secondary | ICD-10-CM

## 2024-04-13 DIAGNOSIS — D6862 Lupus anticoagulant syndrome: Secondary | ICD-10-CM

## 2024-04-14 DIAGNOSIS — M5431 Sciatica, right side: Secondary | ICD-10-CM | POA: Diagnosis not present

## 2024-04-14 DIAGNOSIS — M9903 Segmental and somatic dysfunction of lumbar region: Secondary | ICD-10-CM | POA: Diagnosis not present

## 2024-04-15 NOTE — Telephone Encounter (Signed)
 Pt is compliant with warfarin management and PCP apts.  Sent in refill of warfarin to requested pharmacy.

## 2024-04-26 ENCOUNTER — Other Ambulatory Visit (HOSPITAL_BASED_OUTPATIENT_CLINIC_OR_DEPARTMENT_OTHER): Payer: Self-pay

## 2024-05-05 DIAGNOSIS — M9903 Segmental and somatic dysfunction of lumbar region: Secondary | ICD-10-CM | POA: Diagnosis not present

## 2024-05-05 DIAGNOSIS — M5431 Sciatica, right side: Secondary | ICD-10-CM | POA: Diagnosis not present

## 2024-05-12 ENCOUNTER — Telehealth: Payer: Self-pay

## 2024-05-12 NOTE — Telephone Encounter (Signed)
 Pt LVM reporting she is out of town and will not be able to make her coumadin  clinic apt tomorrow. Requested it be RS for Friday, 8/15. She reported she will see this on mychart.  RS apt

## 2024-05-13 ENCOUNTER — Ambulatory Visit

## 2024-05-16 ENCOUNTER — Ambulatory Visit (INDEPENDENT_AMBULATORY_CARE_PROVIDER_SITE_OTHER)

## 2024-05-16 DIAGNOSIS — Z7901 Long term (current) use of anticoagulants: Secondary | ICD-10-CM | POA: Diagnosis not present

## 2024-05-16 LAB — POCT INR: INR: 2.4 (ref 2.0–3.0)

## 2024-05-16 NOTE — Progress Notes (Signed)
 Continue 2 tablets daily except take 1 1/2 tablet on Monday, Wednesday and Friday. Recheck in 6 weeks.

## 2024-05-16 NOTE — Patient Instructions (Addendum)
 Pre visit review using our clinic review tool, if applicable. No additional management support is needed unless otherwise documented below in the visit note.  Continue 2 tablets daily except take 1 1/2 tablet on Monday, Wednesday and Friday. Recheck in 6 weeks.

## 2024-06-16 DIAGNOSIS — M5431 Sciatica, right side: Secondary | ICD-10-CM | POA: Diagnosis not present

## 2024-06-16 DIAGNOSIS — M9903 Segmental and somatic dysfunction of lumbar region: Secondary | ICD-10-CM | POA: Diagnosis not present

## 2024-06-27 ENCOUNTER — Ambulatory Visit (INDEPENDENT_AMBULATORY_CARE_PROVIDER_SITE_OTHER)

## 2024-06-27 DIAGNOSIS — Z23 Encounter for immunization: Secondary | ICD-10-CM | POA: Diagnosis not present

## 2024-06-27 DIAGNOSIS — Z7901 Long term (current) use of anticoagulants: Secondary | ICD-10-CM

## 2024-06-27 LAB — POCT INR: INR: 2.6 (ref 2.0–3.0)

## 2024-06-27 NOTE — Patient Instructions (Addendum)
 Pre visit review using our clinic review tool, if applicable. No additional management support is needed unless otherwise documented below in the visit note.  Continue 2 tablets daily except take 1 1/2 tablet on Monday, Wednesday and Friday. Recheck in 6 weeks.

## 2024-06-27 NOTE — Progress Notes (Signed)
 Continue 2 tablets daily except take 1 1/2 tablet on Monday, Wednesday and Friday. Recheck in 6 weeks.  Pt requested flu vaccine. Flu vaccine administered. Pt tolerated well.

## 2024-06-30 DIAGNOSIS — R760 Raised antibody titer: Secondary | ICD-10-CM | POA: Diagnosis not present

## 2024-06-30 DIAGNOSIS — M25571 Pain in right ankle and joints of right foot: Secondary | ICD-10-CM | POA: Diagnosis not present

## 2024-06-30 DIAGNOSIS — M25511 Pain in right shoulder: Secondary | ICD-10-CM | POA: Diagnosis not present

## 2024-06-30 DIAGNOSIS — M064 Inflammatory polyarthropathy: Secondary | ICD-10-CM | POA: Diagnosis not present

## 2024-07-02 ENCOUNTER — Ambulatory Visit
Admission: RE | Admit: 2024-07-02 | Discharge: 2024-07-02 | Disposition: A | Source: Ambulatory Visit | Attending: Internal Medicine | Admitting: Internal Medicine

## 2024-07-02 DIAGNOSIS — R928 Other abnormal and inconclusive findings on diagnostic imaging of breast: Secondary | ICD-10-CM | POA: Diagnosis not present

## 2024-07-02 DIAGNOSIS — N6489 Other specified disorders of breast: Secondary | ICD-10-CM

## 2024-07-02 DIAGNOSIS — M5431 Sciatica, right side: Secondary | ICD-10-CM | POA: Diagnosis not present

## 2024-07-02 DIAGNOSIS — M9903 Segmental and somatic dysfunction of lumbar region: Secondary | ICD-10-CM | POA: Diagnosis not present

## 2024-07-21 ENCOUNTER — Other Ambulatory Visit (HOSPITAL_BASED_OUTPATIENT_CLINIC_OR_DEPARTMENT_OTHER): Payer: Self-pay

## 2024-08-05 DIAGNOSIS — M5431 Sciatica, right side: Secondary | ICD-10-CM | POA: Diagnosis not present

## 2024-08-05 DIAGNOSIS — M9903 Segmental and somatic dysfunction of lumbar region: Secondary | ICD-10-CM | POA: Diagnosis not present

## 2024-08-08 ENCOUNTER — Ambulatory Visit

## 2024-08-11 ENCOUNTER — Telehealth: Payer: Self-pay

## 2024-08-11 NOTE — Telephone Encounter (Signed)
 Pt LVM to RS coumadin  clinic apt tomorrow from morning to afternoon. She requested if available she prefers 3:30. Can just change it and she will see it on mychart.   Changed apt to 3:45

## 2024-08-12 ENCOUNTER — Ambulatory Visit (INDEPENDENT_AMBULATORY_CARE_PROVIDER_SITE_OTHER)

## 2024-08-12 DIAGNOSIS — Z23 Encounter for immunization: Secondary | ICD-10-CM

## 2024-08-12 DIAGNOSIS — Z7901 Long term (current) use of anticoagulants: Secondary | ICD-10-CM | POA: Diagnosis not present

## 2024-08-12 LAB — POCT INR: INR: 3 (ref 2.0–3.0)

## 2024-08-12 NOTE — Progress Notes (Signed)
 Indication: Anti-cardiolipin antibody positive, DVT Continue 2 tablets daily except take 1 1/2 tablet on Monday, Wednesday and Friday. Recheck in 5 weeks. Pt requested from pt, second Hep B vaccine. Per PCP note on 03/31/24 pt is due for series of Heplisav. Pt received first dose on 03/31/24. Per orders of Dr. Joshua, injection of Heplisav given by Clotilda Public. Patient tolerated injection well.

## 2024-08-12 NOTE — Patient Instructions (Addendum)
 Pre visit review using our clinic review tool, if applicable. No additional management support is needed unless otherwise documented below in the visit note.  Continue 2 tablets daily except take 1 1/2 tablet on Monday, Wednesday and Friday. Recheck in 5 weeks.

## 2024-08-16 ENCOUNTER — Other Ambulatory Visit: Payer: Self-pay | Admitting: Internal Medicine

## 2024-08-16 DIAGNOSIS — Z6837 Body mass index (BMI) 37.0-37.9, adult: Secondary | ICD-10-CM

## 2024-08-21 ENCOUNTER — Other Ambulatory Visit: Payer: Self-pay | Admitting: Internal Medicine

## 2024-08-21 DIAGNOSIS — Z6837 Body mass index (BMI) 37.0-37.9, adult: Secondary | ICD-10-CM

## 2024-08-22 ENCOUNTER — Other Ambulatory Visit (HOSPITAL_BASED_OUTPATIENT_CLINIC_OR_DEPARTMENT_OTHER): Payer: Self-pay

## 2024-08-22 MED ORDER — ZEPBOUND 15 MG/0.5ML ~~LOC~~ SOAJ
15.0000 mg | SUBCUTANEOUS | 0 refills | Status: AC
  Filled 2024-08-22: qty 2, 28d supply, fill #0
  Filled 2024-09-14: qty 2, 28d supply, fill #1
  Filled 2024-10-13: qty 2, 28d supply, fill #2

## 2024-08-22 NOTE — Telephone Encounter (Signed)
 Pt contacted coumadin  clinic requesting help with a refill of Zepbound . She reports the pharmacy has requested a refill twice over the last week but have not received a response.  Advised a msg will be sent to PCP. Pt appreciative of the help.

## 2024-08-22 NOTE — Telephone Encounter (Signed)
 Contacted pt and advised script was sent by PCP. Pt appreciative.

## 2024-08-25 DIAGNOSIS — M5431 Sciatica, right side: Secondary | ICD-10-CM | POA: Diagnosis not present

## 2024-08-25 DIAGNOSIS — M9903 Segmental and somatic dysfunction of lumbar region: Secondary | ICD-10-CM | POA: Diagnosis not present

## 2024-09-10 DIAGNOSIS — M5431 Sciatica, right side: Secondary | ICD-10-CM | POA: Diagnosis not present

## 2024-09-10 DIAGNOSIS — M9903 Segmental and somatic dysfunction of lumbar region: Secondary | ICD-10-CM | POA: Diagnosis not present

## 2024-09-16 ENCOUNTER — Telehealth

## 2024-09-16 ENCOUNTER — Ambulatory Visit

## 2024-09-16 DIAGNOSIS — Z7901 Long term (current) use of anticoagulants: Secondary | ICD-10-CM

## 2024-09-16 DIAGNOSIS — H01009 Unspecified blepharitis unspecified eye, unspecified eyelid: Secondary | ICD-10-CM | POA: Diagnosis not present

## 2024-09-16 LAB — POCT INR: INR: 2.7 (ref 2.0–3.0)

## 2024-09-16 MED ORDER — PREDNISONE 10 MG (21) PO TBPK: ORAL_TABLET | ORAL | 0 refills | Status: AC

## 2024-09-16 MED ORDER — ERYTHROMYCIN 5 MG/GM OP OINT: 1.0000 | TOPICAL_OINTMENT | Freq: Two times a day (BID) | OPHTHALMIC | 0 refills | Status: AC

## 2024-09-16 NOTE — Patient Instructions (Addendum)
 Pre visit review using our clinic review tool, if applicable. No additional management support is needed unless otherwise documented below in the visit note.  Continue 2 tablets daily except take 1 1/2 tablet on Monday, Wednesday and Friday. Recheck in 4 weeks.

## 2024-09-16 NOTE — Patient Instructions (Addendum)
 Zoe Miles, thank you for joining Zoe Velma Lunger, PA-C for today's virtual visit.  While this provider is not your primary care provider (PCP), if your PCP is located in our provider database this encounter information will be shared with them immediately following your visit.   A [facility] MyChart account gives you access to today's visit and all your visits, tests, and labs performed at [facility]  click here if you don't have a [facility] MyChart account or go to [email or web address]  Consent: (Patient) Zoe Miles provided verbal consent for this virtual visit at the beginning of the encounter.  Current Medications:  Current Outpatient Medications:    cycloSPORINE (RESTASIS) 0.05 % ophthalmic emulsion, Place 1 drop into both eyes 2 (two) times daily., Disp: , Rfl:    hydrocortisone  (ANUSOL -HC) 2.5 % rectal cream, Place 1 Application rectally 3 (three) times daily., Disp: 45 g, Rfl: 1   hydroxychloroquine (PLAQUENIL) 200 MG tablet, Take 200 mg by mouth 2 (two) times daily. , Disp: , Rfl:    norethindrone (AYGESTIN) 5 MG tablet, Take 5 mg by mouth daily., Disp: , Rfl:    tirzepatide  (ZEPBOUND ) 15 MG/0.5ML Pen, Inject 15 mg into the skin once a week., Disp: 6 mL, Rfl: 0   warfarin (COUMADIN ) 5 MG tablet, TAKE 2 TABLETS DAILY EXCEPT 1 1/2 TABLETS ON MONDAY, WEDNESDAY AND FRIDAY OR AS DIRECTED BY CLINIC, Disp: 200 tablet, Rfl: 1   Medications ordered in this encounter:  No orders of the defined types were placed in this encounter.    *If you need refills on other medications prior to your next appointment, please contact your pharmacy*  Follow-Up: Call back or seek an in-person evaluation if the symptoms worsen or if the condition fails to improve as anticipated.  [facility] [phone]  Other Instructions Warm compresses and gentle wipes to help clean the eyelids. I recommend a daily antihistamine OTC like Claritin or  Zyrtec for inflammation and itch. Start topical medication as directed Take steroid as directed. Avoid topical steroids to the area.  You can also look into some OTC  preparations like Avenova Eyelid wash or OcuSoft HypoChlor spray to use periodically to help prevent.   If you note any non-resolving, new, or worsening symptoms despite treatment, please seek an in-person evaluation ASAP.   Blepharitis Blepharitis is swelling of the eyelids. It can cause the eyes to feel dry or gritty. Other symptoms may include: Reddish, scaly skin around the scalp and eyebrows. Eyelids that itch or burn. Fluid that leaks from the eye at night. This causes the eyelashes to stick together in the morning. Eyelashes that fall out. Redness of the eyes. Eyes that are sensitive to light. Follow these instructions at home: Watch for any changes in how your eyes look or feel. Tell your doctor about any changes. Follow these instructions to help with your condition. Keeping clean Wash your hands often with soap and water for at least 20 seconds. Clean your eyes. Wash the edges of your eyelids using eyelid wipes or a small amount of baby shampoo that has been mixed with warm water (diluted). Do this 2 or more times a day. Wash your face and eyebrows at least once a day. Use a clean towel each time you dry your eyelids. Do not use the towel to clean or dry other areas of your body. Do not share your towel with anyone. General instructions Avoid wearing makeup until you get better. Do not share  makeup with anyone. Avoid rubbing your eyes. Use a warm compress on your eyes for 5-10 minutes at a time. Do this 1 or 2 times a day, or as told by your doctor. You can use: A towel with warm water on it. A heating pad that can be warmed in the microwave. The pad should be very warm but not hot enough to burn the skin. If you were given an antibiotic cream or eye drops, use the medicine as told by your doctor. Do not stop  using the medicine even if you feel better. Keep all follow-up visits. Contact a doctor if: Your eyelids feel hot. You have blisters on your eyelids. You have a rash on your eyelids. The swelling does not go away in 2-4 days. The swelling gets worse. Get help right away if: You have pain that gets worse or spreads to other parts of your face. You have redness that gets worse or spreads to other parts of your face. You have changes in how you see (vision). You have pain when you look at lights or things that move. You have a fever. Summary Blepharitis is swelling of the eyelids. Watch for any changes in how your eyes look or feel. Tell your doctor about any changes. Follow home care instructions as told by your doctor. Wash your hands often with soap and water for at least 20 seconds. Avoid wearing makeup. Do not rub your eyes. Use a warm compress, creams, or eye drops as told by your doctor. Let your doctor know if you have changes in how you see, blisters or a rash on your eyelids, or other problems. This information is not intended to replace advice given to you by your health care provider. Make sure you discuss any questions you have with your health care provider. Document Revised: 10/20/2020 Document Reviewed: 10/20/2020 Elsevier Patient Education  2024 Elsevier Inc.   If you have been instructed to have an in-person evaluation today at a local Urgent Care facility, please use the link below. It will take you to a list of all of our available [facility], including address, phone number and hours of operation. Please do not delay care.  [facility]  If you or a family member do not have a primary care provider, use the link below to schedule a visit and establish care. When you choose a [facility] primary care physician or advanced practice provider, you gain a long-term partner in health. Find a Primary Care Provider  Learn more about [facility]'s  in-office and virtual care options:  - Get Care Now

## 2024-09-16 NOTE — Progress Notes (Signed)
 Virtual Visit Consent   [name], you are scheduled for a virtual visit with a [facility] provider today. Just as with appointments in the office, your consent must be obtained to participate. Your consent will be active for this visit and any virtual visit you may have with one of our providers in the next 365 days. If you have a MyChart account, a copy of this consent can be sent to you electronically.  As this is a virtual visit, video technology does not allow for your provider to perform a traditional examination. This may limit your provider's ability to fully assess your condition. If your provider identifies any concerns that need to be evaluated in person or the need to arrange testing (such as labs, EKG, etc.), we will make arrangements to do so. Although advances in technology are sophisticated, we cannot ensure that it will always work on either your end or our end. If the connection with a video visit is poor, the visit may have to be switched to a telephone visit. With either a video or telephone visit, we are not always able to ensure that we have a secure connection.  By engaging in this virtual visit, you consent to the provision of healthcare and authorize for your insurance to be billed (if applicable) for the services provided during this visit. Depending on your insurance coverage, you may receive a charge related to this service.  I need to obtain your verbal consent now. Are you willing to proceed with your visit today? Zaila Crew has provided verbal consent on 09/16/2024 for a virtual visit (video or telephone). Zoe Miles, [place]  Date: 09/16/2024 8:58 AM   Virtual Visit via Video Note   I, Zoe Miles, connected with  Tenika Keeran  ([id number], [birth year]) on 09/16/2024 at  8:00 AM EST by a video-enabled telemedicine application and verified that I am speaking with the correct person using two identifiers.  Location: Patient:  Virtual Visit Location Patient: Home Provider: Virtual Visit Location Provider: Home Office   I discussed the limitations of evaluation and management by telemedicine and the availability of in person appointments. The patient expressed understanding and agreed to proceed.    History of Present Illness: Zoe Miles is a 48 y.o. who identifies as a female who was assigned female at birth, and is being seen today for bilateral eyelid redness, itching and dryness over the past few weeks. Denies any change to soaps, lotions, makeup, etc. Some occasional eye dryness without eye irritation or redness. Is a contact lens wearer. SABRA   HPI: HPI  Problems:  Patient Active Problem List   Diagnosis Date Noted   Inflammatory arthritis 03/31/2024   Primary hypertension 03/12/2023   Multiple dysplastic nevi 03/12/2023   Class 2 severe obesity due to excess calories with serious comorbidity and body mass index (BMI) of 37.0 to 37.9 in adult 03/08/2022   Encounter for general adult medical examination with abnormal findings 03/08/2022   PMDD (premenstrual dysphoric disorder) 03/08/2022   Anti-cardiolipin antibody positive 08/19/2018   Kidney stones    Long term (current) use of anticoagulants 05/29/2018   Systemic lupus erythematosus (HCC) 11/20/2017   Lupus anticoagulant with hypercoagulable state 11/20/2017    Allergies: Allergies[1] Medications: Current Medications[2]  Observations/Objective: Patient is well-developed, well-nourished in no acute distress.  Resting comfortably  at home.  Head is normocephalic, atraumatic.  No labored breathing.  Speech is clear and coherent with logical content.  Patient is alert  and oriented at baseline.  Bilateral upper and lower eyelid erythema and irritation noted without flaking or scaling. Conjunctiva within normal limits bilaterally. Pupils are equal and round.  Assessment and Plan: 1. Anterior blepharitis (Primary) - erythromycin  ophthalmic  ointment; Place 1 Application into both eyes in the morning and at bedtime.  Dispense: 3.5 g; Refill: 0  Supportive measures and OTC medications reviewed. Erythromycin  topically per orders. Prednisone  pack as directed. Daily Claritin or Zyrtec. Avoidance of topical steroids.  Follow Up Instructions: I discussed the assessment and treatment plan with the patient. The patient was provided an opportunity to ask questions and all were answered. The patient agreed with the plan and demonstrated an understanding of the instructions.  A copy of instructions were sent to the patient via MyChart unless otherwise noted below.   The patient was advised to call back or seek an in-person evaluation if the symptoms worsen or if the condition fails to improve as anticipated.    Zoe Velma Lunger, PA-C    [1]  Allergies Allergen Reactions   Ciprofloxacin Nausea And Vomiting    Pt experiences diarrhea nausea and vomiting  [2]  Current Outpatient Medications:    erythromycin  ophthalmic ointment, Place 1 Application into both eyes in the morning and at bedtime., Disp: 3.5 g, Rfl: 0   cycloSPORINE (RESTASIS) 0.05 % ophthalmic emulsion, Place 1 drop into both eyes 2 (two) times daily., Disp: , Rfl:    hydrocortisone  (ANUSOL -HC) 2.5 % rectal cream, Place 1 Application rectally 3 (three) times daily., Disp: 45 g, Rfl: 1   hydroxychloroquine (PLAQUENIL) 200 MG tablet, Take 200 mg by mouth 2 (two) times daily. , Disp: , Rfl:    norethindrone (AYGESTIN) 5 MG tablet, Take 5 mg by mouth daily., Disp: , Rfl:    tirzepatide  (ZEPBOUND ) 15 MG/0.5ML Pen, Inject 15 mg into the skin once a week., Disp: 6 mL, Rfl: 0   warfarin (COUMADIN ) 5 MG tablet, TAKE 2 TABLETS DAILY EXCEPT 1 1/2 TABLETS ON MONDAY, WEDNESDAY AND FRIDAY OR AS DIRECTED BY CLINIC, Disp: 200 tablet, Rfl: 1

## 2024-09-16 NOTE — Progress Notes (Signed)
 Indication: Anti-cardiolipin antibody positive, DVT Pt was seen by a provider today for Anterior blepharitis and prescribed erythromycin  eye ointment and prednisone  dose pack. Prednisone  can interact with warfarin or not interact at all. Unable to check INR next week due to holiday and pt going out of town. Advised pt to watch for s/s of bleeding and also s/s of a clot. Pt verbalized understanding. Will recheck in 4 weeks per pt request.  Continue 2 tablets daily except take 1 1/2 tablet on Monday, Wednesday and Friday. Recheck in 4 weeks.

## 2024-10-14 ENCOUNTER — Ambulatory Visit

## 2024-10-14 DIAGNOSIS — Z7901 Long term (current) use of anticoagulants: Secondary | ICD-10-CM

## 2024-10-14 LAB — POCT INR: INR: 2.9 (ref 2.0–3.0)

## 2024-10-14 NOTE — Patient Instructions (Addendum)
 Pre visit review using our clinic review tool, if applicable. No additional management support is needed unless otherwise documented below in the visit note.  Continue 2 tablets daily except take 1 1/2 tablet on Monday, Wednesday and Friday. Recheck in 6 weeks.

## 2024-10-14 NOTE — Progress Notes (Signed)
 Indication: Anti-cardiolipin antibody positive, DVT Continue 2 tablets daily except take 1 1/2 tablet on Monday, Wednesday and Friday. Recheck in 6 weeks.

## 2024-10-26 ENCOUNTER — Other Ambulatory Visit: Payer: Self-pay | Admitting: Internal Medicine

## 2024-10-26 DIAGNOSIS — Z7901 Long term (current) use of anticoagulants: Secondary | ICD-10-CM

## 2024-10-27 NOTE — Telephone Encounter (Signed)
 Pt is compliant with warfarin management and PCP apts.  Sent in refill of warfarin to requested pharmacy.

## 2024-10-30 ENCOUNTER — Ambulatory Visit
Admission: RE | Admit: 2024-10-30 | Discharge: 2024-10-30 | Disposition: A | Discharge status: Home / Self Care | Source: Ambulatory Visit

## 2024-10-30 ENCOUNTER — Telehealth: Admitting: Emergency Medicine

## 2024-10-30 VITALS — BP 131/39 | HR 79 | Temp 98.1°F | Resp 17

## 2024-10-30 DIAGNOSIS — R319 Hematuria, unspecified: Secondary | ICD-10-CM | POA: Diagnosis not present

## 2024-10-30 DIAGNOSIS — Z7901 Long term (current) use of anticoagulants: Secondary | ICD-10-CM | POA: Insufficient documentation

## 2024-10-30 DIAGNOSIS — Z87442 Personal history of urinary calculi: Secondary | ICD-10-CM | POA: Diagnosis not present

## 2024-10-30 DIAGNOSIS — R3915 Urgency of urination: Secondary | ICD-10-CM | POA: Insufficient documentation

## 2024-10-30 DIAGNOSIS — Z86718 Personal history of other venous thrombosis and embolism: Secondary | ICD-10-CM | POA: Insufficient documentation

## 2024-10-30 DIAGNOSIS — Z3202 Encounter for pregnancy test, result negative: Secondary | ICD-10-CM | POA: Diagnosis not present

## 2024-10-30 DIAGNOSIS — Z87440 Personal history of urinary (tract) infections: Secondary | ICD-10-CM | POA: Insufficient documentation

## 2024-10-30 LAB — POCT URINE DIPSTICK
Bilirubin, UA: NEGATIVE
Glucose, UA: NEGATIVE mg/dL
Ketones, POC UA: NEGATIVE mg/dL
Leukocytes, UA: NEGATIVE
Nitrite, UA: NEGATIVE
Protein Ur, POC: NEGATIVE mg/dL
Spec Grav, UA: 1.01
Urobilinogen, UA: 0.2 U/dL
pH, UA: 6

## 2024-10-30 LAB — POCT URINE PREGNANCY: Preg Test, Ur: NEGATIVE

## 2024-10-30 MED ORDER — CEPHALEXIN 500 MG PO CAPS: 500.0000 mg | ORAL_CAPSULE | Freq: Two times a day (BID) | ORAL | 0 refills | Status: AC

## 2024-10-30 MED ORDER — TAMSULOSIN HCL 0.4 MG PO CAPS: 0.4000 mg | ORAL_CAPSULE | Freq: Every day | ORAL | 0 refills | Status: AC

## 2024-10-30 MED ORDER — ONDANSETRON 4 MG PO TBDP: 4.0000 mg | ORAL_TABLET | Freq: Three times a day (TID) | ORAL | 0 refills | Status: AC | PRN

## 2024-10-30 NOTE — ED Triage Notes (Signed)
 Pt has c/o urinary frequency since yesterday, and bladder pressure today. Pt had a virtual visit, and prescribed Keflex , but wants to confirm that she has UTI.

## 2024-10-30 NOTE — ED Provider Notes (Signed)
 " [facility]    CSN: [id number] Arrival date & time: 10/30/24  1318      History   Chief Complaint Chief Complaint  Patient presents with   Urinary Frequency    Had a virtual visit with [facility] provider but would like urinalysis to confirm uti - Entered by patient    HPI Zoe Miles is a 49 y.o. female.   Patient presents today with 2-day history of urinary urgency, amber-colored urine, and feeling more aware of abdomen and flank.  She denies dysuria, urinary frequency, voiding smaller amounts, new urinary incontinence, vomiting, and vaginal discharge.  She reports over the past couple of days, she has had some hot flashes and nausea episodes but does not vomit.  Reports she was recently traveling to [place] carrying a heavy backpack at a convention and through the airport.  Also reports she is covering from a chest cold and her kids had influenza last week; she was coughing a lot but this has improved.  Patient reports history of UTI and kidney stones.  Reports symptoms mimic similar to the beginning of UTI in the past.  She takes Coumadin  for history of DVT, clotting disorder and has never had blood in her urine before with UTIs.  She did a televisit earlier today and was prescribed Keflex  but wanted to make sure it was indeed a urinary tract infection before taking the Keflex .    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   Clotting disorder    History of blood clots    History of DVT of lower extremity 08/19/2018   Kidney stones    Lupus     Patient Active Problem List   Diagnosis Date Noted   Inflammatory arthritis 03/31/2024   Primary hypertension 03/12/2023   Multiple dysplastic nevi 03/12/2023   Class 2 severe obesity due to excess calories with serious comorbidity and body mass index (BMI) of 37.0 to 37.9 in adult 03/08/2022   Encounter for general adult medical examination with abnormal  findings 03/08/2022   PMDD (premenstrual dysphoric disorder) 03/08/2022   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      Home Medications    Prior to Admission medications  Medication Sig Start Date End Date Taking? Authorizing Provider  ondansetron  (ZOFRAN -ODT) 4 MG disintegrating tablet Take 1 tablet (4 mg total) by mouth every 8 (eight) hours as needed for nausea or vomiting. 10/30/24  Yes Chandra Raisin A, NP  tamsulosin  (FLOMAX ) 0.4 MG CAPS capsule Take 1 capsule (0.4 mg total) by mouth daily after supper. 10/30/24  Yes Chandra Raisin LABOR, NP  cephALEXin  (KEFLEX ) 500 MG capsule Take 1 capsule (500 mg total) by mouth 2 (two) times daily. 10/30/24   Vicky Charleston, PA-C  cycloSPORINE (RESTASIS) 0.05 % ophthalmic emulsion Place 1 drop into both eyes 2 (two) times daily. 08/11/16   [provider]  erythromycin  ophthalmic ointment Place 1 Application into both eyes in the morning and at bedtime. 09/16/24   Gladis Elsie BROCKS, PA-C  hydrocortisone  (ANUSOL -HC) 2.5 % rectal cream Place 1 Application  rectally 3 (three) times daily. 07/06/23   Zehr, Nkechi Linehan D, PA-C  hydroxychloroquine (PLAQUENIL) 200 MG tablet Take 200 mg by mouth 2 (two) times daily.     [provider]  norethindrone (AYGESTIN) 5 MG tablet Take 5 mg by mouth daily. 03/11/24   [provider]  predniSONE  (STERAPRED UNI-PAK 21 TAB) 10 MG (21) TBPK tablet Take following package directions 09/16/24   Gladis Elsie BROCKS, PA-C  tirzepatide  (ZEPBOUND ) 15 MG/0.5ML Pen Inject 15 mg into the skin once a week. 08/22/24   Joshua Debby CROME, MD  warfarin  (COUMADIN ) 5 MG tablet TAKE 2 TABLETS DAILY EXCEPT 1 1/2 TABLETS ON MONDAY, [facility] AND FRIDAY OR AS DIRECTED BY ANTICOAGULATION CLINIC 10/27/24   Joshua Debby CROME, MD    Family History Family History  Problem Relation Age of Onset   Healthy Mother    Hypertension Father    Hypercholesterolemia Father    Diabetes Father    Coronary artery disease Father    Diabetes Maternal Grandmother    Heart disease Maternal Grandfather    Breast cancer Paternal Grandmother    Cancer Paternal Grandmother    Lung cancer Paternal Grandfather    Cancer Paternal Grandfather    Colon cancer Maternal Aunt    Diverticulitis Paternal Uncle     Social History Social History[1]   Allergies   Ciprofloxacin   Review of Systems Review of Systems Per HPI  Physical Exam Triage Vital Signs ED Triage Vitals [10/30/24 1332]  Encounter Vitals Group     BP (!) 131/39     Girls Systolic BP Percentile      Girls Diastolic BP Percentile      Boys Systolic BP Percentile      Boys Diastolic BP Percentile      Pulse Rate 79     Resp 17     Temp 98.1 F (36.7 C)     Temp Source Oral     SpO2 100 %     Weight      Height      Head Circumference      Peak Flow      Pain Score      Pain Loc      Pain Education      Exclude from Growth Chart    No data found.  Updated Vital Signs BP (!) 131/39 (BP Location: Left Arm)   Pulse 79   Temp 98.1 F (36.7 C) (Oral)   Resp 17   SpO2 100%   Blood pressure charted incorrectly: 131/93  Visual Acuity Right Eye Distance:   Left Eye Distance:   Bilateral Distance:    Right Eye Near:   Left Eye Near:    Bilateral Near:     Physical Exam Vitals and nursing note reviewed.  Constitutional:      General: She is not in acute distress.    Appearance: She is not toxic-appearing.  HENT:     Mouth/Throat:     Mouth: Mucous membranes are moist.     Pharynx: Oropharynx is clear.  Abdominal:     General: Abdomen is flat. Bowel sounds are normal.  There is no distension.     Palpations: Abdomen is soft. There is no mass.     Tenderness: There is no abdominal tenderness. There is no right CVA tenderness, left CVA tenderness or guarding.  Skin:    General: Skin is warm and dry.     Coloration: Skin is not jaundiced or pale.  Findings: No erythema.  Neurological:     Mental Status: She is alert and oriented to person, place, and time.     Motor: No weakness.     Gait: Gait normal.  Psychiatric:        Behavior: Behavior is cooperative.      UC Treatments / Results  Labs (all labs ordered are listed, but only abnormal results are displayed) Labs Reviewed  POCT URINE DIPSTICK - Abnormal; Notable for the following components:      Result Value   Color, UA light yellow (*)    Blood, UA large (*)    All other components within normal limits  POCT URINE PREGNANCY - Normal  URINE CULTURE    EKG   Radiology No results found.  Procedures Procedures (including critical care time)  Medications Ordered in UC Medications - No data to display  Initial Impression / Assessment and Plan / UC Course  I have reviewed the triage vital signs and the nursing notes.  Pertinent labs & imaging results that were available during my care of the patient were reviewed by me and considered in my medical decision making (see chart for details).   Patient is a very pleasant, well-appearing 49 year old female presenting today with hematuria.  Vital signs are stable and exam overall is reassuring.  Urinalysis is dilute, shows large amount of blood, otherwise unremarkable.  Urine culture is pending.  We discussed possible etiologies including acute cystitis with hematuria versus nephrolithiasis.  Will start Flomax  to treat for possible kidney stone while waiting for urine culture results.  Prescription for Zofran  was also sent to the pharmacy for as needed nausea/vomiting.  Strict ER precautions were discussed with the patient.  The patient was  given the opportunity to ask questions.  All questions answered to their satisfaction.  The patient is in agreement to this plan.   Final Clinical Impressions(s) / UC Diagnoses   Final diagnoses:  Hematuria, unspecified type  Urine pregnancy test negative     Discharge Instructions      The urine sample today shows a large amount of blood which is the only abnormality.  We are sending for a urine culture and we will contact you if it shows that there is bacteria that requires treatment with antibiotics.  In the meantime, you can start Flomax  to treat for possible kidney stone.  Zofran  has been sent to the pharmacy as well to help with any nausea or vomiting.  Seek care in the emergency room if your symptoms worsen significantly.     ED Prescriptions     Medication Sig Dispense Auth. Provider   tamsulosin  (FLOMAX ) 0.4 MG CAPS capsule Take 1 capsule (0.4 mg total) by mouth daily after supper. 14 capsule Chandra Raisin A, NP   ondansetron  (ZOFRAN -ODT) 4 MG disintegrating tablet Take 1 tablet (4 mg total) by mouth every 8 (eight) hours as needed for nausea or vomiting. 20 tablet Chandra Raisin LABOR, NP      PDMP not reviewed this encounter.    [1]  Social History Tobacco Use   Smoking status: Never    Passive exposure: Never   Smokeless tobacco: Never  Vaping Use   Vaping status: Never Used  Substance Use Topics   Alcohol use: Not Currently    Comment: socially very rare   Drug use: No     Chandra Raisin LABOR, NP 10/30/24 1430  "

## 2024-10-30 NOTE — Progress Notes (Signed)
 " Virtual Visit Consent   Zoe Miles, you are scheduled for a virtual visit with a [place] provider today. Just as with appointments in the office, your consent must be obtained to participate. Your consent will be active for this visit and any virtual visit you may have with one of our providers in the next 365 days. If you have a MyChart account, a copy of this consent can be sent to you electronically.  As this is a virtual visit, video technology does not allow for your provider to perform a traditional examination. This may limit your provider's ability to fully assess your condition. If your provider identifies any concerns that need to be evaluated in person or the need to arrange testing (such as labs, EKG, etc.), we will make arrangements to do so. Although advances in technology are sophisticated, we cannot ensure that it will always work on either your end or our end. If the connection with a video visit is poor, the visit may have to be switched to a telephone visit. With either a video or telephone visit, we are not always able to ensure that we have a secure connection.  By engaging in this virtual visit, you consent to the provision of healthcare and authorize for your insurance to be billed (if applicable) for the services provided during this visit. Depending on your insurance coverage, you may receive a charge related to this service.  I need to obtain your verbal consent now. Are you willing to proceed with your visit today? Zoe Miles has provided verbal consent on 10/30/2024 for a virtual visit (video or telephone). Lamar Schlossman, PA-C  Date: 10/30/2024 12:44 PM   Virtual Visit via Video Note   I, Lamar Schlossman, connected with  Zoe Miles  ([id number], 24-Jan-1976) on 10/30/24 at 12:45 PM EST by a video-enabled telemedicine application and verified that I am speaking with the correct person using two identifiers.  Location: Patient: Virtual  Visit Location Patient: Home Provider: Virtual Visit Location Provider: Home Office   I discussed the limitations of evaluation and management by telemedicine and the availability of in person appointments. The patient expressed understanding and agreed to proceed.    History of Present Illness: Zoe Miles is a 49 y.o. who identifies as a female who was assigned female at birth, and is being seen today for hematuria.  She denies dysuria, urgency, or frequency.  She states that she does have slight pressure sensation that is reminiscent of prior UTIs.  She asks if we can test her urine.  She denies fever, vomiting.  She is anticoagulated on Coumadin .  HPI: HPI  Problems:  Patient Active Problem List   Diagnosis Date Noted   Inflammatory arthritis 03/31/2024   Primary hypertension 03/12/2023   Multiple dysplastic nevi 03/12/2023   Class 2 severe obesity due to excess calories with serious comorbidity and body mass index (BMI) of 37.0 to 37.9 in adult 03/08/2022   Encounter for general adult medical examination with abnormal findings 03/08/2022   PMDD (premenstrual dysphoric disorder) 03/08/2022   Anti-cardiolipin antibody positive 08/19/2018   Kidney stones    Long term (current) use of anticoagulants 05/29/2018   Systemic lupus erythematosus (HCC) 11/20/2017   Lupus anticoagulant with hypercoagulable state 11/20/2017    Allergies: Allergies[1] Medications: Current Medications[2]  Observations/Objective: Patient is well-developed, well-nourished in no acute distress.  Resting comfortably at home.  Head is normocephalic, atraumatic.  No labored breathing.  Speech is clear and coherent with  logical content.  Patient is alert and oriented at baseline.    Assessment and Plan: 1. Hematuria, unspecified type (Primary)  Meds ordered this encounter  Medications   cephALEXin  (KEFLEX ) 500 MG capsule    Sig: Take 1 capsule (500 mg total) by mouth 2 (two) times daily.     Dispense:  14 capsule    Refill:  0    Supervising Provider:   BLAISE ALEENE KIDD [[id number]]   Recommend UA at an urgent care, but start ABX if symptoms change or worsen.  Follow Up Instructions: I discussed the assessment and treatment plan with the patient. The patient was provided an opportunity to ask questions and all were answered. The patient agreed with the plan and demonstrated an understanding of the instructions.  A copy of instructions were sent to the patient via MyChart unless otherwise noted below.     The patient was advised to call back or seek an in-person evaluation if the symptoms worsen or if the condition fails to improve as anticipated.    Lamar Schlossman, PA-C     [1]  Allergies Allergen Reactions   Ciprofloxacin Nausea And Vomiting    Pt experiences diarrhea nausea and vomiting  [2]  Current Outpatient Medications:    cephALEXin  (KEFLEX ) 500 MG capsule, Take 1 capsule (500 mg total) by mouth 2 (two) times daily., Disp: 14 capsule, Rfl: 0   cycloSPORINE (RESTASIS) 0.05 % ophthalmic emulsion, Place 1 drop into both eyes 2 (two) times daily., Disp: , Rfl:    erythromycin  ophthalmic ointment, Place 1 Application into both eyes in the morning and at bedtime., Disp: 3.5 g, Rfl: 0   hydrocortisone  (ANUSOL -HC) 2.5 % rectal cream, Place 1 Application rectally 3 (three) times daily., Disp: 45 g, Rfl: 1   hydroxychloroquine (PLAQUENIL) 200 MG tablet, Take 200 mg by mouth 2 (two) times daily. , Disp: , Rfl:    norethindrone (AYGESTIN) 5 MG tablet, Take 5 mg by mouth daily., Disp: , Rfl:    predniSONE  (STERAPRED UNI-PAK 21 TAB) 10 MG (21) TBPK tablet, Take following package directions, Disp: 21 tablet, Rfl: 0   tirzepatide  (ZEPBOUND ) 15 MG/0.5ML Pen, Inject 15 mg into the skin once a week., Disp: 6 mL, Rfl: 0   warfarin (COUMADIN ) 5 MG tablet, TAKE 2 TABLETS DAILY EXCEPT 1 1/2 TABLETS ON MONDAY, WEDNESDAY AND FRIDAY OR AS DIRECTED BY ANTICOAGULATION CLINIC, Disp: 200  tablet, Rfl: 1  "

## 2024-10-30 NOTE — Discharge Instructions (Addendum)
 The urine sample today shows a large amount of blood which is the only abnormality.  We are sending for a urine culture and we will contact you if it shows that there is bacteria that requires treatment with antibiotics.  In the meantime, you can start Flomax  to treat for possible kidney stone.  Zofran  has been sent to the pharmacy as well to help with any nausea or vomiting.  Seek care in the emergency room if your symptoms worsen significantly.

## 2024-10-30 NOTE — Patient Instructions (Signed)
" °  Zoe Miles, thank you for joining Lamar Schlossman, PA-C for today's virtual visit.  While this provider is not your primary care provider (PCP), if your PCP is located in our provider database this encounter information will be shared with them immediately following your visit.   A [facility] MyChart account gives you access to today's visit and all your visits, tests, and labs performed at [facility]  click here if you don't have a [facility] MyChart account or go to [email or web address]  Consent: (Patient) Zoe Miles provided verbal consent for this virtual visit at the beginning of the encounter.  Current Medications:  Current Outpatient Medications:    cephALEXin  (KEFLEX ) 500 MG capsule, Take 1 capsule (500 mg total) by mouth 2 (two) times daily., Disp: 14 capsule, Rfl: 0   cycloSPORINE (RESTASIS) 0.05 % ophthalmic emulsion, Place 1 drop into both eyes 2 (two) times daily., Disp: , Rfl:    erythromycin  ophthalmic ointment, Place 1 Application into both eyes in the morning and at bedtime., Disp: 3.5 g, Rfl: 0   hydrocortisone  (ANUSOL -HC) 2.5 % rectal cream, Place 1 Application rectally 3 (three) times daily., Disp: 45 g, Rfl: 1   hydroxychloroquine (PLAQUENIL) 200 MG tablet, Take 200 mg by mouth 2 (two) times daily. , Disp: , Rfl:    norethindrone (AYGESTIN) 5 MG tablet, Take 5 mg by mouth daily., Disp: , Rfl:    predniSONE  (STERAPRED UNI-PAK 21 TAB) 10 MG (21) TBPK tablet, Take following package directions, Disp: 21 tablet, Rfl: 0   tirzepatide  (ZEPBOUND ) 15 MG/0.5ML Pen, Inject 15 mg into the skin once a week., Disp: 6 mL, Rfl: 0   warfarin (COUMADIN ) 5 MG tablet, TAKE 2 TABLETS DAILY EXCEPT 1 1/2 TABLETS ON MONDAY, WEDNESDAY AND FRIDAY OR AS DIRECTED BY ANTICOAGULATION CLINIC, Disp: 200 tablet, Rfl: 1   Medications ordered in this encounter:  Meds ordered this encounter  Medications   cephALEXin  (KEFLEX ) 500 MG capsule    Sig: Take 1 capsule  (500 mg total) by mouth 2 (two) times daily.    Dispense:  14 capsule    Refill:  0    Supervising Provider:   BLAISE ALEENE KIDD [[id number]]     *If you need refills on other medications prior to your next appointment, please contact your pharmacy*  Follow-Up: Call back or seek an in-person evaluation if the symptoms worsen or if the condition fails to improve as anticipated.  [facility] [phone]  Other Instructions   If you have been instructed to have an in-person evaluation today at a local Urgent Care facility, please use the link below. It will take you to a list of all of our available [facility], including address, phone number and hours of operation. Please do not delay care.  [facility]  If you or a family member do not have a primary care provider, use the link below to schedule a visit and establish care. When you choose a [place] primary care physician or advanced practice provider, you gain a long-term partner in health. Find a Primary Care Provider  Learn more about 's in-office and virtual care options:  - Get Care Now  "

## 2024-11-02 ENCOUNTER — Ambulatory Visit (HOSPITAL_COMMUNITY): Payer: Self-pay

## 2024-11-02 LAB — URINE CULTURE: Culture: 40000 — AB

## 2024-11-25 ENCOUNTER — Ambulatory Visit
